# Patient Record
Sex: Male | Born: 1955 | Race: White | Hispanic: No | Marital: Married | Smoking: Former smoker
Health system: Southern US, Community
[De-identification: ages and names within clinical notes are randomized; demographics above are authoritative.]

## PROBLEM LIST (undated history)

## (undated) DIAGNOSIS — I10 Essential (primary) hypertension: Secondary | ICD-10-CM

## (undated) DIAGNOSIS — E1169 Type 2 diabetes mellitus with other specified complication: Secondary | ICD-10-CM

## (undated) DIAGNOSIS — C801 Malignant (primary) neoplasm, unspecified: Secondary | ICD-10-CM

## (undated) DIAGNOSIS — I499 Cardiac arrhythmia, unspecified: Secondary | ICD-10-CM

## (undated) HISTORY — PX: HERNIA REPAIR: SHX51

---

## 1978-09-21 HISTORY — PX: TRIGGER FINGER RELEASE: SHX641

## 2010-09-09 DIAGNOSIS — D039 Melanoma in situ, unspecified: Secondary | ICD-10-CM

## 2010-09-09 HISTORY — DX: Melanoma in situ, unspecified: D03.9

## 2013-09-21 HISTORY — PX: PROSTATECTOMY: SHX69

## 2016-09-21 HISTORY — PX: CARDIAC ELECTROPHYSIOLOGY MAPPING AND ABLATION: SHX1292

## 2017-02-10 DIAGNOSIS — C4492 Squamous cell carcinoma of skin, unspecified: Secondary | ICD-10-CM

## 2017-02-10 HISTORY — DX: Squamous cell carcinoma of skin, unspecified: C44.92

## 2019-10-31 ENCOUNTER — Other Ambulatory Visit: Payer: Self-pay | Admitting: Orthopedic Surgery

## 2019-11-24 ENCOUNTER — Encounter
Admission: RE | Admit: 2019-11-24 | Discharge: 2019-11-24 | Disposition: A | Discharge status: Home / Self Care | Payer: BC Managed Care – PPO | Source: Ambulatory Visit | Attending: Orthopedic Surgery | Admitting: Orthopedic Surgery

## 2019-11-24 ENCOUNTER — Other Ambulatory Visit: Payer: Self-pay

## 2019-11-24 HISTORY — DX: Type 2 diabetes mellitus with other specified complication: E11.69

## 2019-11-24 HISTORY — DX: Cardiac arrhythmia, unspecified: I49.9

## 2019-11-24 HISTORY — DX: Malignant (primary) neoplasm, unspecified: C80.1

## 2019-11-24 HISTORY — DX: Essential (primary) hypertension: I10

## 2019-11-24 NOTE — Patient Instructions (Addendum)
INSTRUCTIONS FOR SURGERY     Your surgery is scheduled for:   Tuesday, MARCH 16TH     To find out your arrival time for the day of surgery,          please call [phone] between 1 pm and 3 pm on :  Monday, MARCH 15TH     When you arrive for surgery, report to the [facility].       Do NOT stop on the first floor to register.    REMEMBER: Instructions that are not followed completely may result in serious medical risk,  up to and including death, or upon the discretion of your surgeon and anesthesiologist,            your surgery may need to be rescheduled.  __X__ 1. Do not eat food after midnight the night before your procedure.                    No gum, candy, lozenger, tic tacs, tums or hard candies.                  ABSOLUTELY NOTHING SOLID IN YOUR MOUTH AFTER MIDNIGHT                    You may drink unlimited clear liquids up to 2 hours before you are scheduled to arrive for surgery.                   Do not drink anything within those 2 hours unless you need to take medicine, then take the                   smallest amount you need.  Clear liquids include:  water, apple juice without pulp,                   any flavor Gatorade, Black coffee, black tea.  Sugar may be added but no dairy/ honey /lemon.                        Broth and jello is not considered a clear liquid.  __x__  2. On the morning of surgery, please brush your teeth with toothpaste and water. You may rinse with                  mouthwash if you wish but DO NOT SWALLOW TOOTHPASTE OR MOUTHWASH  __X___3. NO alcohol for 24 hours before or after surgery.  __x___ 4.  Do NOT smoke or use e-cigarettes for 24 HOURS PRIOR TO SURGERY.                      DO NOT Use any chewable tobacco products for at least 6 hours prior to surgery.  __x___ 5. If you start any new medication after this appointment and prior to surgery, please    Bring it with you on the day of surgery.  ___x__ 6. Notify your doctor if there is any change in your medical condition, such as fever, infection, vomitting,  Diarrhea or any open sores.  __x___ 7.  USE the CHG SOAP as instructed, the night before surgery and the day of surgery.                   Once you have washed with this soap, do NOT use any of the following: Powders, perfumes                    or lotions. Please do not wear make up, hairpins, clips or nail polish. You MAY NOT wear deodorant.                   Men may shave their face and neck.  Women need to shave 48 hours prior to surgery.                   DO NOT wear ANY jewelry on the day of surgery. If there are rings that are too tight to                    remove easily, please address this prior to the surgery day. Piercings need to be removed.                                                                     NO METAL ON YOUR BODY.                    Do NOT bring any valuables.  If you came to Pre-Admit testing then you will not need license,                     insurance card or credit card.  If you will be staying overnight, please either leave your things in                     the car or have your family be responsible for these items.                     [facility] IS NOT RESPONSIBLE FOR BELONGINGS OR VALUABLES.  ___X__ 8. DO NOT wear contact lenses on surgery day.  You may not have dentures,                     Hearing aides, contacts or glasses in the operating room. These items can be                    Placed in the Recovery Room to receive immediately after surgery.  __x___ 9. IF YOU ARE SCHEDULED TO GO HOME ON THE SAME DAY, YOU MUST                   Have someone to drive you home and to stay with you  for the first 24 hours.                    Have an arrangement prior to arriving on surgery day.  ___x__ 10. Take the following medications on the morning of surgery with a sip of water:  1.  CARDIZEM                     2.  METOPROLOL                     3.  ZOLOFT                     4.  TRAMADOL, if you need it                     5.                     6.  _____ 11.  Follow any instructions provided to you by your surgeon.                        Such as enema, clear liquid bowel prep  __X__  12. STOP  ASPIRIN AS OF:  MARCH 9TH (one week prior to surgery)                       THIS INCLUDES BC POWDERS / GOODIES POWDER  __x___ 13. STOP Anti-inflammatories as of: MARCH 9TH (one week prior to surgery)                      This includes IBUPROFEN / MOTRIN / ADVIL / ALEVE/ NAPROXYN                    YOU MAY TAKE TYLENOL ANY TIME PRIOR TO SURGERY.  __X____16.  Stop Metformin 2 full days prior to surgery.  Stop AFTER EVENING DOSE ON                       Saturday, MARCH 13TH.                      Do NOT take any diabetes medications on surgery day.  __X____17.  Continue to take the following medications but do not take on the morning of surgery:                            LOTENSIN // JARDIANCE  ___x___18. Wear clean and comfortable clothing to the hospital.                    A large, oversized button up shirt works best.  If you have a tshirt or pajama top that you                     Will wear once home, please bring that so that we can help you get into that.

## 2019-12-01 ENCOUNTER — Other Ambulatory Visit: Payer: Self-pay

## 2019-12-01 ENCOUNTER — Encounter
Admission: RE | Admit: 2019-12-01 | Discharge: 2019-12-01 | Disposition: A | Discharge status: Home / Self Care | Payer: BC Managed Care – PPO | Source: Ambulatory Visit | Attending: Orthopedic Surgery | Admitting: Orthopedic Surgery

## 2019-12-01 DIAGNOSIS — E118 Type 2 diabetes mellitus with unspecified complications: Secondary | ICD-10-CM | POA: Insufficient documentation

## 2019-12-01 DIAGNOSIS — Z20822 Contact with and (suspected) exposure to covid-19: Secondary | ICD-10-CM | POA: Diagnosis not present

## 2019-12-01 DIAGNOSIS — Z01818 Encounter for other preprocedural examination: Secondary | ICD-10-CM | POA: Insufficient documentation

## 2019-12-01 DIAGNOSIS — I10 Essential (primary) hypertension: Secondary | ICD-10-CM | POA: Insufficient documentation

## 2019-12-01 LAB — CBC WITH DIFFERENTIAL/PLATELET
Abs Immature Granulocytes: 0.02 10*3/uL (ref 0.00–0.07)
Basophils Absolute: 0 10*3/uL (ref 0.0–0.1)
Basophils Relative: 1 %
Eosinophils Absolute: 0.3 10*3/uL (ref 0.0–0.5)
Eosinophils Relative: 5 %
HCT: 43.3 % (ref 39.0–52.0)
Hemoglobin: 14.9 g/dL (ref 13.0–17.0)
Immature Granulocytes: 0 %
Lymphocytes Relative: 25 %
Lymphs Abs: 1.8 10*3/uL (ref 0.7–4.0)
MCH: 30.2 pg (ref 26.0–34.0)
MCHC: 34.4 g/dL (ref 30.0–36.0)
MCV: 87.7 fL (ref 80.0–100.0)
Monocytes Absolute: 0.8 10*3/uL (ref 0.1–1.0)
Monocytes Relative: 11 %
Neutro Abs: 4 10*3/uL (ref 1.7–7.7)
Neutrophils Relative %: 58 %
Platelets: 240 10*3/uL (ref 150–400)
RBC: 4.94 MIL/uL (ref 4.22–5.81)
RDW: 12.4 % (ref 11.5–15.5)
WBC: 6.9 10*3/uL (ref 4.0–10.5)
nRBC: 0 % (ref 0.0–0.2)

## 2019-12-01 LAB — BASIC METABOLIC PANEL
Anion gap: 10 (ref 5–15)
BUN: 19 mg/dL (ref 8–23)
CO2: 23 mmol/L (ref 22–32)
Calcium: 9.3 mg/dL (ref 8.9–10.3)
Chloride: 105 mmol/L (ref 98–111)
Creatinine, Ser: 0.9 mg/dL (ref 0.61–1.24)
GFR calc Af Amer: >60 mL/min (ref >60)
GFR calc non Af Amer: >60 mL/min (ref >60)
Glucose, Bld: 180 mg/dL — ABNORMAL HIGH (ref 70–99)
Potassium: 3.9 mmol/L (ref 3.5–5.1)
Sodium: 138 mmol/L (ref 135–145)

## 2019-12-01 LAB — PROTIME-INR
INR: 0.9 (ref 0.8–1.2)
Prothrombin Time: 11.6 seconds (ref 11.4–15.2)

## 2019-12-01 LAB — APTT: aPTT: 30 seconds (ref 24–36)

## 2019-12-01 NOTE — Pre-Procedure Instructions (Signed)
ECG 12-lead7/24/2019 [place] Component Name Value Ref Range  Vent Rate (bpm) 67   PR Interval (msec) 192   QRS Interval (msec) 150   QT Interval (msec) 458   QTc (msec) 483   Other Result Information  This result has an attachment that is not available.  Result Narrative  Sinus rhythm with frequent premature ventricular complexes in a pattern of bigeminy Left anterior fascicular block Right bundle branch block  Bifascicular block  Abnormal ECG When compared with ECG of 13-Apr-2017 14:34, T wave inversion less evident in Anterior leads I reviewed and concur with this report. Electronically signed [id number] MD, Jeneen Rinks [phone]) on 04/18/2018 2:58:43 PM  Status Results Details   Encounter Summary

## 2019-12-01 NOTE — Pre-Procedure Instructions (Signed)
Progress Notes - documented in this encounter Leonia Reeves, MD - 05/15/2019 7:45 AM EDT Formatting of this note might be different from the original. Established Patient Visit   Reason for referral: Chief Complaint  Patient presents with  . Follow-up  Date of Service: 05/15/2019 Date of Birth: 03-04-1956 PCP: Velna Ochs, MD  History of Present Illness: Lawrence Flores is a 64 y.o.male patient with a history of atrial flutter (s/p RFA, 2016) who presents for Cardiology follow-up.  Lawrence Flores has been doing well from a cardiac standpoint. He states that every time he goes for a procedure there is mention of PVCs or his heart rate. He is asymptomatic from this and has noticed an improvement with them since starting on diltiazem. His dyspnea has been stable and he has noticed a great improvement in his breathing since losing weight. The patient denies orthopnea, and paroxysmal nocturnal dyspnea. He also denies palpitations, syncope, pre-syncope, and chest pain. His HCTZ was stopped due to leg cramping.  His blood pressure has been well controlled. He is due for labs but is not scheduled for lab work.   Review of Systems  All other systems reviewed and are negative except as documented in the HPI.   Past Medical and Surgical History   Past Medical History:  Diagnosis Date  . Allergic state  MRI dye. Possibly anectine  . Arthritis  . Bundle branch block, right 03/04/2016  . Cataract cortical, senile [id number]  Age appropriate  . 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 Depression, prolonged 09/06/2018  . Diabetes (CMS-HCC)  . Elevated PSA 12/25/2013  . History of melanoma in situ 07/28/2011  . Hypertension  . Malignant melanoma (CMS-HCC)  . Malignant neoplasm of prostate (CMS-HCC) 05/02/2014  . 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 Obstructive sleep apnea 04/06/2016   Past Surgical History He has a past surgical history that  includes Umbilical hernia repair (1979); Laminectomy Lumbar Spine; laparoscopic prostatectomy robotic (N/A, 07/19/2014); laparoscopic pelvic lymph node sampling (Left, 07/19/2014); and Colonoscopy (03/13/2019).   Medications and Allergies   Current Outpatient Medications  Medication Sig Dispense Refill  . aspirin 81 MG EC tablet Take 81 mg by mouth once daily.  . benazepriL (LOTENSIN) 40 MG tablet Take 1 tablet (40 mg total) by mouth once daily 90 tablet 3  . CONTOUR NEXT TEST STRIPS test strip TEST 3 TIMES DAILY 100 each 0  . diltiazem (CARDIZEM CD) 240 MG CD capsule Take 1 capsule (240 mg total) by mouth once daily 90 capsule 3  . empagliflozin (JARDIANCE) 10 mg tablet Take 1 tablet (10 mg total) by mouth daily with breakfast 30 tablet 11  . metFORMIN (GLUCOPHAGE) 500 MG tablet TAKE 1 TABLET BY MOUTH TWICE DAILY WITH MEALS 60 tablet 11  . metoprolol succinate (TOPROL-XL) 25 MG XL tablet Take 1 tablet (25 mg total) by mouth once daily 90 tablet 3  . rosuvastatin (CRESTOR) 10 MG tablet Take 1 tablet (10 mg total) by mouth once daily 90 tablet 3  . sertraline (ZOLOFT) 100 MG tablet Take 1 tablet (100 mg total) by mouth once daily 30 tablet 11  . traMADoL (ULTRAM) 50 mg tablet TAKE 1 TABLET BY MOUTH TWICE DAILY AS NEEDED FOR PAIN 30 tablet 1  . sildenafil (VIAGRA) 100 MG tablet Take 1/2 tablet 3 nights a week for penile rehab (Patient not taking: Reported on 05/15/2019 ) 10 tablet 11   No current facility-administered medications for this visit.   Allergies:  Anectine [succinylcholine chloride] and Iodinated contrast media  Social and Family History   Social History   Tobacco Use  . Smoking status: Former Smoker  Packs/day: 2.00  Years: 20.00  Pack years: 40.00  Types: Cigarettes  Quit date: 09/21/1994  Years since quitting: 24.6  . Smokeless tobacco: Former Systems developer  Types: Chew, Snuff  . Tobacco comment: quit smokeless tobacco in 1996 as well  Substance Use Topics  . Alcohol use: Not  Currently  Comment: reports h/o heavy ETOH use, however denies daily ETOH consumption currently  . Drug use: Never   Family History: family history includes Alcohol abuse in his father, maternal grandfather, and maternal uncle; Alzheimer's disease in his father and mother; Anesthesia problems in his father and sister; Anxiety in his mother; Breast cancer in his mother; Diabetes type II in his father, mother, and paternal grandfather; Glaucoma in his father; High blood pressure (Hypertension) in his mother; Prostate cancer in his paternal grandfather; Skin cancer in his father; Thyroid disease in his mother.  Physical Examination  Vitals:BP 112/70 (BP Location: Left upper arm, Patient Position: Sitting, BP Cuff Size: Adult)  Pulse 62  Temp 36.5 C (97.7 F)  Ht 177.8 cm (5\' 10" )  Wt (!) 112.8 kg (248 lb 10.9 oz)  BMI 35.68 kg/m   Physical Exam  Constitutional: He appears healthy. No distress.  HENT:  Mouth/Throat: Oropharynx is clear.  Eyes: Pupils are equal, round, and reactive to light. Conjunctivae are normal.  Neck: Normal range of motion and thyroid normal. Neck supple. No JVD present.  Cardiovascular: Regular rate and rhythm, S2 normal, normal heart sounds and normal pulses. PMI is not displaced.  Pulmonary/Chest: Breath sounds normal. He exhibits no tenderness.  Abdominal: Soft. Bowel sounds are normal. He exhibits no distension. There is no tenderness.  Musculoskeletal: No edema.  Neurological: He is alert and oriented to person, place, and time.  Skin: Skin is warm. No cyanosis. Nails show no clubbing.    Lab and Other Data   - Lab Results  Component Value Date  CHOLTOTAL 188 07/05/2018  TRIG 109 07/05/2018  HDL 58.0 07/05/2018  LDLCALC 108 07/05/2018   EKG (March 04, 2016): Typical atrial flutter with right bundle branch block  Left Heart Catheterization 05/03/18 Impressions:  LVEDP 27mmHg  No significant CAD 30% midRCA 20-30% ostial LCx 10-20%  prox/midLAD  Mild coronary ectasia, throughout.  Stress Echo 04/25/18 INTERPRETATION ABNORMAL STRESS ECHOCARDIOGRAM LCX TERRITORY WALL MOTION ABNORMALITIES WITH PEAK EXERTION STRESS EKG RESULT IS LIKELY A FALSE NEGATIVE RESULT. NO SIGNIFICANT VALVULAR DYSFUNCTION NORMAL RV SIZE AND FUNCTION NO PRIOR SE FOR COMPARISON  Assessment and Plan   1. Essential hypertension, benign  2. Bundle branch block, right  3. Typical atrial flutter (CMS-HCC)  4. DOE (dyspnea on exertion)  5. Heart palpitations  6. Hyperlipemia, mixed  7. Tachycardia   1. Atrial flutter. Typical. CHADSVASC 1 (HTN). S/p RFA without recurrence. Continued on Cardizem without [place].   2. Hypertension. His blood pressure is well controlled on current regimen.   3. Ventricular bigeminy. Continue Carvedilol.   4. Right bundle branch block (with 1st degree AV block). This has been stable. No need for further treatment at this time. Annual EKG for surveillance.   5. Hyperlipidemia. Will check fasting lipid panel today and adjust his Crestor dose as needed.   I appreciate the opportunity to participate in his care. As always, please feel free to call with questions.  Requested Prescriptions   Signed Prescriptions Disp Refills  . diltiazem (  CARDIZEM CD) 240 MG CD capsule 90 capsule 3  Sig: Take 1 capsule (240 mg total) by mouth once daily  . rosuvastatin (CRESTOR) 10 MG tablet 90 tablet 3  Sig: Take 1 tablet (10 mg total) by mouth once daily  . metoprolol succinate (TOPROL-XL) 25 MG XL tablet 90 tablet 3  Sig: Take 1 tablet (25 mg total) by mouth once daily  . benazepriL (LOTENSIN) 40 MG tablet 90 tablet 3  Sig: Take 1 tablet (40 mg total) by mouth once daily   Non-Medicine Orders: Orders Placed This Encounter  Procedures  . Alanine Aminotransferase (ALT)  . Aspartate Aminotransferase (AST)  . Lipid Panel W/Reflex Direct Low Density Lipoprotein (LDL) Cholesterol  . Basic Metabolic Panel (BMP)  . Hemoglobin A1C   . Complete Blood Count (CBC) with Differential   Future Appointments  You do not currently have any appointments scheduled.    Scribe Attestation: Gonzella Lex, am acting as scribe for Jeannetta Nap, MD.    Electronically signed by Leonia Reeves, MD at 05/19/2019 1:18 PM EDT

## 2019-12-01 NOTE — Pre-Procedure Instructions (Signed)
Telephone Encounter - Leonia Reeves, MD - 11/02/2019 2:33 PM EST Thanks for the note. No need for further testing. Ok to stop aspirin. Continue other meds. -MB  Electronically signed by Leonia Reeves, MD at 11/02/2019 2:33 PM EST  Back to top of Miscellaneous Notes Telephone Encounter - Sharlyn Bologna, RN - 10/30/2019 3:45 PM EST Formatting of this note might be different from the original. Received pre-op clearance request from [facility]  Name of surgeon: Mack Guise  Type of surgery: R shoulder arthroscopic  Time line for surgery: TBD  Type of anesthesia: general  Date of last- Stress: Echo: Cath: Office Visit: 04/2018 04/2018 04/2018 04/2019  Meds: ASA 81mg   Cardiac symptoms? no   Nursing note: Form in CC To be faxed to [facility]. [facility] [phone]  Electronically signed by Sharlyn Bologna, RN at 10/30/2019 4:35 PM EST

## 2019-12-01 NOTE — Pre-Procedure Instructions (Addendum)
SECURE CHAT WITH DR Kayleen Memos:  Pt having Shoulder Surgery with Dr Mack Guise 3-16. Pt with htn and h/o A-flutter. Can you review EKG done today. Pt had one done in 2019 in [place] and the interpretation is the same. He did have Stress Echo done in 2019 in [place] ( I copy and pasted so you can review). Dr Tobe Sos (cardiologist) did say in Feb no further testing was needed when [facility] office got cardiac clearance on pt for this surgery. Are we still ok to proceed since this EKG has same interpretation as the one in 2019?  Lawrence Flores, I reviewed the cardiologists note from 8/20 and he has unchanged RBBB with occasional PVCs . He should be OK for surgery without clearance. If the patient has a primary MD , we could fax the EKG over to them to see if any issues on their part, but I see a stable chronic situation with minimal CAD on cardiac cath according to cardiology  [facility] Dr Tobe Sos (cardiology) did say last month that pt did not need any additional testing from his cardiology standpoint. I just wanted to make sure we were ok with the EKG done today. Thanks!  Yes.Marland KitchenMarland Kitchen

## 2019-12-01 NOTE — Pre-Procedure Instructions (Signed)
Echo stress test8/01/2018 [facility] Component Name Value Ref Range  LV Ejection Fraction (%) 55   Aortic Valve Regurgitation Grade trivial   Aortic Valve Stenosis Grade none   Mitral Valve Regurgitation Grade trivial   Mitral Valve Stenosis Grade none   Tricuspid Valve Regurgitation Grade trivial   LV End Diastolic Diameter (cm) 5.4 cm  LV End Systolic Diameter (cm) 3.3 cm  LV Septum Wall Thickness (cm) 1.3 cm  LV Posterior Wall Thickness (cm) 1.3 cm  Left Atrium Diameter (cm) 3.8 cm  Result Narrative           [facility]         [place], [place]      An affiliate of [facility]           [phone]      [place] #: [id number]                                Date: 04/25/2018 08: [place], [place] [address]                  Adult  Male  Age: 64 yrs                                Outpatient            ECHOCARDIOGRAM REPORT          DUH^^THA                                MD1: Ernesta Amble MATTHEW    STUDY:Stress Echo       TAPE:    ECHO:Yes  DOPPLER:Yes    FILE:0000-000-000    BP: 120/71 mmHg    COLOR:Yes  CONTRAST:No   MACHINE:GE         HR: 74  RV BIOPSY:No     3D:No SOUND QLTY:Moderate      Height: 70 in   MEDIUM:None                       Weight: 249 lb                                BSA: 2.3 m2 _________________________________________________________________________________________       Indication:Indication:  Indication: Bundle branch block, right [I45.10, DOE                          (dyspnea on exertion)  [R06.09 _________________________________________________________________________________________ STRESS ECHOCARDIOGRAPHY        Protocol: Treadmill (Bruce)         Drugs: None       Target HR: 134 bpm      Maximum Predicted HR: 158 bpm +----------------------------------------------------------------------------+ :Stage      Duration           HR     BP         : :  SITTING   :              :74    :120/71       : :---------------+----------------------------+----------+---------+     : :  STANDING   :              :68    :123/75       : :---------------+----------------------------+----------+---------+     : :  STAGE 1   :3:00            :100    :148/59       : :---------------+----------------------------+----------+---------+     : :  STAGE 2   :3:00            :113    :182/56       : :---------------:----------------------------:----------:---------+     : :  STAGE 3   :3:00            :141    :214/77       : :---------------+----------------------------+----------+---------+     : :  STAGE 4   :0:18            :136    :/          : :---------------+----------------------------+----------+---------+     : : RECOVERY   :7:02            :78    :113/71       : +----------------------------------------------------------------------------+    Stress Duration: 9: 18 mm: ss    Max Stress H.R.: 141 bpm       Target HR Achieved: Yes Maximum workload of 10.50 METS was achieved during exercise _________________________________________________________________________________________ WALL SEGMENT CHANGES             Rest        Stress    Anterior Septum: Normal       Hyperkinetic      Anterior Wall: Normal       Hyperkinetic      Lateral Wall: Normal       HYPOKINETIC     Posterior Wall: Normal       Hyperkinetic     Inferior Wall: Normal       Hyperkinetic    Inferior Septum: Normal       Hyperkinetic          Apex: Normal       HYPOKINETIC       Resting EF: >55% (Est.)         Stress EF: 45% (Est.)  _________________________________________________________________________________________ ADDITIONAL FINDINGS _________________________________________________________________________________________ STRESS ECG RESULTS      ECG Results: Normal _________________________________________________________________________________________ ECHOCARDIOGRAPHIC DESCRIPTIONS LEFT VENTRICLE          Size: Normal      Contraction: Normal       LV Masses: No Masses          LVH: Mild LVH CONCENTRIC      Dias.FxClass: N/A RIGHT VENTRICLE          Size: Normal            Free Wall: Normal      Contraction: Normal            RV Masses: No mass PERICARDIUM         Fluid: No effusion _________________________________________________________________________________________  DOPPLER ECHO and OTHER SPECIAL PROCEDURES         Aortic: TRIVIAL AR         No AS         Mitral: TRIVIAL MR         No MS             MV Inflow E Vel = 95.4 cm/sec    MV Annulus  E'Vel = nm*             E/E'Ratio = nm*       Tricuspid: TRIVIAL TR         No TS       Pulmonary: No PR           No PS _________________________________________________________________________________________ ECHOCARDIOGRAPHIC MEASUREMENTS 2D DIMENSIONS AORTA         Values  Normal Range  MAIN PA     Values  Normal Range        Annulus: nm*    [2.3-2.9]      PA  Main: nm*    [1.5-2.1]       Aorta Sin: 3.9 cm   [3.1-3.7]  RIGHT VENTRICLE      ST Junction: nm*    [2.6-3.2]      RV Base: nm*    [<4.2]       Asc.Aorta: nm*    [2.6-3.4]      RV Mid: nm*    [<3.5] LEFT VENTRICLE                   RV Length: nm*    [<8.6]         LVIDd: 5.4 cm   [4.2-5.9]  INFERIOR VENA CAVA         LVIDs: 3.3 cm            Max. IVC: nm*    [<=2.1]           FS: 39.1 %   [>25]       Min. IVC: nm*          SWT: 1.3 cm   [0.6-1.0]  ------------------          PWT: 1.3 cm   [0.6-1.0]  nm* - not measured LEFT ATRIUM        LA Diam: 3.8 cm   [3.0-4.0]      LA A4C Area: 23.6 cm2  [< 20]       LA Volume: 63.1 ml  [18-58] _________________________________________________________________________________________ INTERPRETATION ABNORMAL STRESS ECHOCARDIOGRAM LCX TERRITORY WALL MOTION ABNORMALITIES WITH PEAK EXERTION STRESS EKG RESULT IS LIKELY A FALSE NEGATIVE RESULT. NO SIGNIFICANT VALVULAR DYSFUNCTION NORMAL RV SIZE AND FUNCTION NO PRIOR SE FOR COMPARISON _________________________________________________________________________________________ Electronically signed by Leonia Reeves, MD on 04/25/2018 05: 13 PM      Performed By: Arville Go, [place]   Ordering Physician: Ernesta Amble _________________________________________________________________________________________  Other Result Information  Interface, Text Results In - 04/25/2018  5:13 PM EDT                   [facility]                  [place]           An affiliate of [facility]           [place] #: [id number]  Date: 04/25/2018 08: 03 AM           [place], [place] [address]                                    Adult   Male   Age: 9 yrs                                                              Outpatient                      ECHOCARDIOGRAM REPORT                   DUH^^THA                                                              MD1: Ernesta Amble MATTHEW      STUDY:Stress Echo              TAPE:       ECHO:Yes    DOPPLER:Yes       FILE:0000-000-000        BP: 120/71 mmHg      COLOR:Yes   CONTRAST:No     MACHINE:GE                  HR: 56  RV BIOPSY:No          3D:No  SOUND QLTY:Moderate            Height: 70 in     MEDIUM:None                                              Weight: 249 lb                                                              BSA: 2.3 m2 _________________________________________________________________________________________            Indication:Indication:    Indication: Bundle branch block, right [I45.10, DOE                                                  (dyspnea on exertion) [R06.09 _________________________________________________________________________________________ STRESS ECHOCARDIOGRAPHY              Protocol: Treadmill (Bruce)                 Drugs: None             Target HR: 134 bpm           Maximum  Predicted HR: 158 bpm +----------------------------------------------------------------------------+ :Stage           Duration                     HR         BP                  : :   SITTING     :                            :74        :120/71              : :---------------+----------------------------+----------+---------+          : :  STANDING     :                            :68        :123/75              : :---------------+----------------------------+----------+---------+          : :   STAGE 1     :3:00                        :100       :148/59              : :---------------+----------------------------+----------+---------+          : :   STAGE 2     :3:00                        :113       :182/56               : :---------------:----------------------------:----------:---------+          : :   STAGE 3     :3:00                        :141       :214/77              : :---------------+----------------------------+----------+---------+          : :   STAGE 4     :0:18                        :136       :/                   : :---------------+----------------------------+----------+---------+          : :  RECOVERY     :7:02                        :78        :113/71              : +----------------------------------------------------------------------------+       Stress Duration: 9: 18 mm: ss       Max Stress H.R.: 141 bpm             Target HR Achieved: Yes Maximum workload of 10.50 METS was achieved during exercise _________________________________________________________________________________________ WALL SEGMENT CHANGES  Rest                Stress       Anterior Septum: Normal              Hyperkinetic         Anterior Wall: Normal              Hyperkinetic          Lateral Wall: Normal              HYPOKINETIC        Posterior Wall: Normal              Hyperkinetic         Inferior Wall: Normal              Hyperkinetic       Inferior Septum: Normal              Hyperkinetic                  Apex: Normal              HYPOKINETIC            Resting EF: >55% (Est.)                  Stress EF: 45% (Est.)  _________________________________________________________________________________________ ADDITIONAL FINDINGS _________________________________________________________________________________________ STRESS ECG RESULTS           ECG Results: Normal _________________________________________________________________________________________ ECHOCARDIOGRAPHIC DESCRIPTIONS LEFT VENTRICLE                  Size: Normal           Contraction: Normal             LV Masses: No Masses                   LVH: Mild LVH CONCENTRIC          Dias.FxClass: N/A RIGHT VENTRICLE                   Size: Normal                       Free Wall: Normal           Contraction: Normal                       RV Masses: No mass PERICARDIUM                 Fluid: No effusion _________________________________________________________________________________________  DOPPLER ECHO and OTHER SPECIAL PROCEDURES                Aortic: TRIVIAL AR                 No AS                Mitral: TRIVIAL MR                 No MS                        MV Inflow E Vel = 95.4 cm/sec       MV Annulus E'Vel = nm*                        E/E'Ratio = nm*  Tricuspid: TRIVIAL TR                 No TS             Pulmonary: No PR                      No PS _________________________________________________________________________________________ ECHOCARDIOGRAPHIC MEASUREMENTS 2D DIMENSIONS AORTA                  Values   Normal Range   MAIN PA         Values    Normal Range               Annulus: nm*        [2.3-2.9]           PA Main: nm*       [1.5-2.1]             Aorta Sin: 3.9 cm     [3.1-3.7]    RIGHT VENTRICLE           ST Junction: nm*        [2.6-3.2]           RV Base: nm*       [<4.2]             Asc.Aorta: nm*        [2.6-3.4]            RV Mid: nm*       [<3.5] LEFT VENTRICLE                                      RV Length: nm*       [<8.6]                 LVIDd: 5.4 cm     [4.2-5.9]    INFERIOR VENA CAVA                 LVIDs: 3.3 cm                        Max. IVC: nm*       [<=2.1]                    FS: 39.1 %     [>25]              Min. IVC: nm*                   SWT: 1.3 cm     [0.6-1.0]    ------------------                   PWT: 1.3 cm     [0.6-1.0]    nm* - not measured LEFT ATRIUM               LA Diam: 3.8 cm     [3.0-4.0]           LA A4C Area: 23.6 cm2   [< 20]             LA Volume: 63.1 ml    [18-58] _________________________________________________________________________________________ INTERPRETATION ABNORMAL STRESS ECHOCARDIOGRAM LCX TERRITORY WALL MOTION  ABNORMALITIES WITH PEAK EXERTION STRESS EKG RESULT IS LIKELY A FALSE NEGATIVE RESULT. NO SIGNIFICANT VALVULAR DYSFUNCTION NORMAL RV SIZE AND FUNCTION NO PRIOR SE FOR COMPARISON _________________________________________________________________________________________ Electronically signed  by Leonia Reeves, MD on 04/25/2018 05: 13 PM          Performed By: Arville Go, [place]    Ordering Physician: Ernesta Amble _________________________________________________________________________________________  Status Results Details   Encounter Summary

## 2019-12-01 NOTE — Pre-Procedure Instructions (Deleted)
Patient Instructions - documented in this encounter Patient Instructions Leonia Reeves, MD - 08/12/2018 10:40 AM EST  Start HCTZ 25mg  daily. If dizziness, then cut to 12.5mg  daily  Start potassium supplement 40meq daily   Patient Education    hydrochlorothiazide Pronunciation: HYE dro KLOR o THY a zide Brand: Microzide What is the most important information I should know about hydrochlorothiazide? You should not use this medicine if you are unable to urinate. What is hydrochlorothiazide? Hydrochlorothiazide is a thiazide diuretic (water pill) that helps prevent your body from absorbing too much salt, which can cause fluid retention. Hydrochlorothiazide is used to treat high blood pressure (hypertension). Hydrochlorothiazide may also be used for purposes not listed in this medication guide. What should I discuss with my healthcare provider before taking hydrochlorothiazide? You should not use hydrochlorothiazide if you are allergic to it, or if you are unable to urinate. To make sure hydrochlorothiazide is safe for you, tell your doctor if you have:  kidney disease;  liver disease;  gout;  glaucoma;  low levels of potassium or sodium in your blood;  high levels of calcium in your blood;  a parathyroid gland disorder;  diabetes; or  an allergy to sulfa drugs or penicillin. Hydrochlorothiazide is not expected to be harmful to an unborn baby. However, if you take this medicine during pregnancy, your newborn baby may develop jaundice or other problems. Tell your doctor if you are pregnant or plan to become pregnant while taking hydrochlorothiazide. Hydrochlorothiazide can pass into breast milk and may harm a nursing baby. You should not breast-feed while using this medicine. Hydrochlorothiazide is not approved for use by anyone younger than 65 years old. How should I take hydrochlorothiazide? Follow all directions on your prescription label. Your doctor may  occasionally change your dose to make sure you get the best results. Do not use this medicine in larger or smaller amounts or for longer than recommended. Hydrochlorothiazide is usually taken once per day. Follow your doctor's dosing instructions very carefully. Call your doctor if you have ongoing vomiting or diarrhea, or if you are sweating more than usual. You can easily become dehydrated while taking this medicine, which can lead to severely low blood pressure or a serious electrolyte imbalance. While using hydrochlorothiazide, you may need frequent medical tests and blood pressure be checks. Your blood and urine may both be tested if you have been vomiting or are dehydrated. If you need surgery, tell the surgeon ahead of time that you are using hydrochlorothiazide. You may need to stop using the medicine for a short time. Keep using this medicine as directed, even if you feel well. High blood pressure often has no symptoms. You may need to use blood pressure medicine for the rest of your life. Store at room temperature away from moisture, heat, and freezing. Keep the bottle tightly closed when not in use. What happens if I miss a dose? Take the missed dose as soon as you remember. Skip the missed dose if it is almost time for your next scheduled dose. Do not take extra medicine to make up the missed dose. What happens if I overdose? Seek emergency medical attention or call the Poison Help line at [phone]. Overdose symptoms may include nausea, weakness, dizziness, dry mouth, thirst, and muscle pain or weakness. What should I avoid while taking hydrochlorothiazide? Drinking alcohol with this medicine can cause side effects. Avoid becoming overheated or dehydrated during exercise, in hot weather, or by not drinking enough fluids. Follow your doctor's  instructions about the type and amount of liquids you should drink. In some cases, drinking too much liquid can be as unsafe as not drinking  enough. What are the possible side effects of hydrochlorothiazide? Get emergency medical help if you have signs of an allergic reaction: hives; difficulty breathing; swelling of your face, lips, tongue, or throat. Call your doctor at once if you have:  a light-headed feeling, like you might pass out;  eye pain, vision problems;  jaundice (yellowing of the skin or eyes);  pale skin, easy bruising, unusual bleeding (nose, mouth, vagina, or rectum);  shortness of breath, wheezing, cough with foamy mucus, chest pain;  signs of electrolyte imbalance --dry mouth, thirst, drowsiness, lack of energy, restlessness, muscle pain or weakness, fast heart rate, nausea and vomiting, little or no urine; or  severe skin reaction --fever, sore throat, swelling in your face or tongue, burning in your eyes, skin pain followed by a red or purple skin rash that spreads (especially in the face or upper body) and causes blistering and peeling. Common side effects may include:  nausea, vomiting, loss of appetite;  diarrhea, constipation;  muscle spasm; or  dizziness, headache. This is not a complete list of side effects and others may occur. Call your doctor for medical advice about side effects. You may report side effects to FDA at 1-800-FDA-1088. What other drugs will affect hydrochlorothiazide? Taking this medicine with other drugs that make you light-headed can worsen this effect. Ask your doctor before taking a sleeping pill, narcotic pain medicine, muscle relaxer, or medicine for anxiety, depression, or seizures. Tell your doctor about all your current medicines and any you start or stop using, especially:  cholestyramine, colestipol;  insulin or oral diabetes medicine;  lithium;  other blood pressure medications;  steroid medicine; or  NSAIDs (nonsteroidal anti-inflammatory drugs) --aspirin, ibuprofen (Advil, Motrin), naproxen (Aleve), celecoxib, diclofenac, indomethacin, meloxicam, and  others. This list is not complete. Other drugs may interact with hydrochlorothiazide, including prescription and over-the-counter medicines, vitamins, and herbal products. Not all possible interactions are listed in this medication guide. Where can I get more information? Your pharmacist can provide more information about hydrochlorothiazide. Remember, keep this and all other medicines out of the reach of children, never share your medicines with others, and use this medication only for the indication prescribed.  Every effort has been made to ensure that the information provided by [employer] is accurate, up-to-date, and complete, but no guarantee is made to that effect. Drug information contained herein may be time sensitive. [employer] information has been compiled for use by healthcare practitioners and consumers in the [place] and therefore [employer] does not warrant that uses outside of the [place] are appropriate, unless specifically indicated otherwise. [employer]'s drug information does not endorse drugs, diagnose patients or recommend therapy. [employer]'s drug information is an Scientist, research (physical sciences) to assist licensed healthcare practitioners in caring for their patients and/or to serve consumers viewing this service as a supplement to, and not a substitute for, the expertise, skill, knowledge and judgment of healthcare practitioners. The absence of a warning for a given drug or drug combination in no way should be construed to indicate that the drug or drug combination is safe, effective or appropriate for any given patient. [employer] does not assume any responsibility for any aspect of healthcare administered with the aid of information [employer] provides. The information contained herein is not intended to cover all possible uses, directions, precautions, warnings, drug interactions, allergic reactions, or  adverse effects. If you have questions about the drugs you are taking,  check with your doctor, nurse or pharmacist. Copyright [id number] [employer] [place]: 12.01. Revision date: 04/30/2015. Care instructions adapted under license by your healthcare professional. If you have questions about a medical condition or this instruction, always ask your healthcare professional. [place] any warranty or liability for your use of this information.     Electronically signed by Leonia Reeves, MD at 08/12/2018 11:05 AM EST

## 2019-12-02 LAB — SARS CORONAVIRUS 2 (TAT 6-24 HRS): SARS Coronavirus 2: NEGATIVE

## 2019-12-05 ENCOUNTER — Encounter
Admission: RE | Disposition: A | Discharge status: Home / Self Care | Payer: Self-pay | Source: Home / Self Care | Attending: Orthopedic Surgery

## 2019-12-05 ENCOUNTER — Encounter: Payer: Self-pay | Admitting: Orthopedic Surgery

## 2019-12-05 ENCOUNTER — Ambulatory Visit: Payer: BC Managed Care – PPO

## 2019-12-05 ENCOUNTER — Other Ambulatory Visit: Payer: Self-pay

## 2019-12-05 ENCOUNTER — Ambulatory Visit: Payer: BC Managed Care – PPO | Admitting: Anesthesiology

## 2019-12-05 ENCOUNTER — Ambulatory Visit
Admission: RE | Admit: 2019-12-05 | Discharge: 2019-12-05 | Disposition: A | Discharge status: Home / Self Care | Payer: BC Managed Care – PPO | Attending: Orthopedic Surgery | Admitting: Orthopedic Surgery

## 2019-12-05 DIAGNOSIS — Z7982 Long term (current) use of aspirin: Secondary | ICD-10-CM | POA: Insufficient documentation

## 2019-12-05 DIAGNOSIS — E785 Hyperlipidemia, unspecified: Secondary | ICD-10-CM | POA: Insufficient documentation

## 2019-12-05 DIAGNOSIS — I10 Essential (primary) hypertension: Secondary | ICD-10-CM | POA: Insufficient documentation

## 2019-12-05 DIAGNOSIS — Z8546 Personal history of malignant neoplasm of prostate: Secondary | ICD-10-CM | POA: Insufficient documentation

## 2019-12-05 DIAGNOSIS — Z87891 Personal history of nicotine dependence: Secondary | ICD-10-CM | POA: Insufficient documentation

## 2019-12-05 DIAGNOSIS — M75111 Incomplete rotator cuff tear or rupture of right shoulder, not specified as traumatic: Secondary | ICD-10-CM | POA: Insufficient documentation

## 2019-12-05 DIAGNOSIS — E119 Type 2 diabetes mellitus without complications: Secondary | ICD-10-CM | POA: Diagnosis not present

## 2019-12-05 DIAGNOSIS — S43401A Unspecified sprain of right shoulder joint, initial encounter: Secondary | ICD-10-CM | POA: Diagnosis present

## 2019-12-05 DIAGNOSIS — Z7984 Long term (current) use of oral hypoglycemic drugs: Secondary | ICD-10-CM | POA: Insufficient documentation

## 2019-12-05 DIAGNOSIS — M7551 Bursitis of right shoulder: Secondary | ICD-10-CM | POA: Diagnosis not present

## 2019-12-05 DIAGNOSIS — X58XXXA Exposure to other specified factors, initial encounter: Secondary | ICD-10-CM | POA: Insufficient documentation

## 2019-12-05 DIAGNOSIS — Z85828 Personal history of other malignant neoplasm of skin: Secondary | ICD-10-CM | POA: Insufficient documentation

## 2019-12-05 DIAGNOSIS — Z419 Encounter for procedure for purposes other than remedying health state, unspecified: Secondary | ICD-10-CM

## 2019-12-05 DIAGNOSIS — Z79899 Other long term (current) drug therapy: Secondary | ICD-10-CM | POA: Diagnosis not present

## 2019-12-05 HISTORY — PX: SHOULDER ARTHROSCOPY WITH CAPSULORRHAPHY: SHX6454

## 2019-12-05 LAB — GLUCOSE, CAPILLARY
Glucose-Capillary: 192 mg/dL — ABNORMAL HIGH (ref 70–99)
Glucose-Capillary: 248 mg/dL — ABNORMAL HIGH (ref 70–99)

## 2019-12-05 SURGERY — SHOULDER ATHROSCOPY WITH CAPSULORRHAPHY
Anesthesia: General | Site: Shoulder | Laterality: Right

## 2019-12-05 MED ORDER — FENTANYL CITRATE (PF) 100 MCG/2ML IJ SOLN
INTRAMUSCULAR | Status: AC
  Filled 2019-12-05: qty 2

## 2019-12-05 MED ORDER — PROPOFOL 500 MG/50ML IV EMUL
INTRAVENOUS | Status: AC
  Filled 2019-12-05: qty 50

## 2019-12-05 MED ORDER — FENTANYL CITRATE (PF) 100 MCG/2ML IJ SOLN: 25.0000 ug | INTRAMUSCULAR | Status: DC | PRN

## 2019-12-05 MED ORDER — SUGAMMADEX SODIUM 200 MG/2ML IV SOLN
INTRAVENOUS | Status: DC | PRN
  Administered 2019-12-05: 200 mg via INTRAVENOUS

## 2019-12-05 MED ORDER — CEFAZOLIN SODIUM-DEXTROSE 2-4 GM/100ML-% IV SOLN
2.0000 g | INTRAVENOUS | Status: AC
  Administered 2019-12-05: 2 g via INTRAVENOUS

## 2019-12-05 MED ORDER — BUPIVACAINE LIPOSOME 1.3 % IJ SUSP
INTRAMUSCULAR | Status: AC
  Filled 2019-12-05: qty 20

## 2019-12-05 MED ORDER — CHLORHEXIDINE GLUCONATE CLOTH 2 % EX PADS: 6.0000 | MEDICATED_PAD | Freq: Once | CUTANEOUS | Status: DC

## 2019-12-05 MED ORDER — CEFAZOLIN SODIUM-DEXTROSE 2-4 GM/100ML-% IV SOLN
INTRAVENOUS | Status: AC
  Filled 2019-12-05: qty 100

## 2019-12-05 MED ORDER — SODIUM CHLORIDE 0.9 % IV SOLN
INTRAVENOUS | Status: DC | PRN
  Administered 2019-12-05: 50 ug/min via INTRAVENOUS

## 2019-12-05 MED ORDER — DEXAMETHASONE SODIUM PHOSPHATE 10 MG/ML IJ SOLN
INTRAMUSCULAR | Status: DC | PRN
  Administered 2019-12-05: 10 mg via INTRAVENOUS

## 2019-12-05 MED ORDER — KETAMINE HCL 50 MG/ML IJ SOLN
INTRAMUSCULAR | Status: DC | PRN
  Administered 2019-12-05: 25 mg via INTRAMUSCULAR

## 2019-12-05 MED ORDER — ACETAMINOPHEN 10 MG/ML IV SOLN
INTRAVENOUS | Status: DC | PRN
  Administered 2019-12-05: 1000 mg via INTRAVENOUS

## 2019-12-05 MED ORDER — SODIUM CHLORIDE 0.9 % IV SOLN: INTRAVENOUS | Status: DC

## 2019-12-05 MED ORDER — OXYCODONE HCL 5 MG PO TABS: 5.0000 mg | ORAL_TABLET | ORAL | 0 refills | Status: AC | PRN

## 2019-12-05 MED ORDER — DEXAMETHASONE SODIUM PHOSPHATE 10 MG/ML IJ SOLN
INTRAMUSCULAR | Status: AC
  Filled 2019-12-05: qty 1

## 2019-12-05 MED ORDER — ROCURONIUM BROMIDE 10 MG/ML (PF) SYRINGE
PREFILLED_SYRINGE | INTRAVENOUS | Status: AC
  Filled 2019-12-05: qty 10

## 2019-12-05 MED ORDER — PROPOFOL 500 MG/50ML IV EMUL
INTRAVENOUS | Status: DC | PRN
  Administered 2019-12-05: 100 ug/kg/min via INTRAVENOUS

## 2019-12-05 MED ORDER — PROPOFOL 10 MG/ML IV BOLUS
INTRAVENOUS | Status: AC
  Filled 2019-12-05: qty 20

## 2019-12-05 MED ORDER — BUPIVACAINE HCL (PF) 0.5 % IJ SOLN
INTRAMUSCULAR | Status: DC | PRN
  Administered 2019-12-05: 10 mL via PERINEURAL

## 2019-12-05 MED ORDER — LACTATED RINGERS IV SOLN
INTRAVENOUS | Status: DC | PRN
  Administered 2019-12-05: 4 mL

## 2019-12-05 MED ORDER — FENTANYL CITRATE (PF) 100 MCG/2ML IJ SOLN: 50.0000 ug | Freq: Once | INTRAMUSCULAR | Status: AC

## 2019-12-05 MED ORDER — PHENYLEPHRINE HCL (PRESSORS) 10 MG/ML IV SOLN
INTRAVENOUS | Status: DC | PRN
  Administered 2019-12-05: 100 ug via INTRAVENOUS

## 2019-12-05 MED ORDER — FAMOTIDINE 20 MG PO TABS: 20.0000 mg | ORAL_TABLET | Freq: Once | ORAL | Status: DC

## 2019-12-05 MED ORDER — FENTANYL CITRATE (PF) 100 MCG/2ML IJ SOLN
INTRAMUSCULAR | Status: AC
  Administered 2019-12-05: 07:00:00 50 ug via INTRAVENOUS
  Filled 2019-12-05: qty 2

## 2019-12-05 MED ORDER — FAMOTIDINE 20 MG PO TABS
ORAL_TABLET | ORAL | Status: AC
  Filled 2019-12-05: qty 1

## 2019-12-05 MED ORDER — BUPIVACAINE HCL (PF) 0.5 % IJ SOLN
INTRAMUSCULAR | Status: AC
  Filled 2019-12-05: qty 10

## 2019-12-05 MED ORDER — GLYCOPYRROLATE 0.2 MG/ML IJ SOLN
INTRAMUSCULAR | Status: AC
  Filled 2019-12-05: qty 1

## 2019-12-05 MED ORDER — SUCCINYLCHOLINE CHLORIDE 200 MG/10ML IV SOSY
PREFILLED_SYRINGE | INTRAVENOUS | Status: AC
  Filled 2019-12-05: qty 10

## 2019-12-05 MED ORDER — ACETAMINOPHEN 10 MG/ML IV SOLN
INTRAVENOUS | Status: AC
  Filled 2019-12-05: qty 100

## 2019-12-05 MED ORDER — KETAMINE HCL 50 MG/ML IJ SOLN
INTRAMUSCULAR | Status: AC
  Filled 2019-12-05: qty 10

## 2019-12-05 MED ORDER — LIDOCAINE HCL (CARDIAC) PF 100 MG/5ML IV SOSY
PREFILLED_SYRINGE | INTRAVENOUS | Status: DC | PRN
  Administered 2019-12-05: 100 mg via INTRAVENOUS

## 2019-12-05 MED ORDER — BUPIVACAINE LIPOSOME 1.3 % IJ SUSP
INTRAMUSCULAR | Status: DC | PRN
  Administered 2019-12-05: 20 mL via PERINEURAL

## 2019-12-05 MED ORDER — LIDOCAINE HCL (PF) 1 % IJ SOLN
INTRAMUSCULAR | Status: DC | PRN
  Administered 2019-12-05: 5 mL

## 2019-12-05 MED ORDER — EPHEDRINE SULFATE 50 MG/ML IJ SOLN
INTRAMUSCULAR | Status: DC | PRN
  Administered 2019-12-05: 10 mg via INTRAVENOUS
  Administered 2019-12-05 (×2): 5 mg via INTRAVENOUS

## 2019-12-05 MED ORDER — MIDAZOLAM HCL 2 MG/2ML IJ SOLN: 1.0000 mg | Freq: Once | INTRAMUSCULAR | Status: AC

## 2019-12-05 MED ORDER — PHENYLEPHRINE HCL (PRESSORS) 10 MG/ML IV SOLN
INTRAVENOUS | Status: AC
  Filled 2019-12-05: qty 1

## 2019-12-05 MED ORDER — FENTANYL CITRATE (PF) 100 MCG/2ML IJ SOLN
INTRAMUSCULAR | Status: DC | PRN
  Administered 2019-12-05 (×3): 50 ug via INTRAVENOUS

## 2019-12-05 MED ORDER — ONDANSETRON HCL 4 MG/2ML IJ SOLN
INTRAMUSCULAR | Status: AC
  Filled 2019-12-05: qty 2

## 2019-12-05 MED ORDER — PROPOFOL 10 MG/ML IV BOLUS
INTRAVENOUS | Status: DC | PRN
  Administered 2019-12-05: 50 mg via INTRAVENOUS
  Administered 2019-12-05: 200 mg via INTRAVENOUS

## 2019-12-05 MED ORDER — LIDOCAINE HCL (PF) 1 % IJ SOLN
INTRAMUSCULAR | Status: AC
  Filled 2019-12-05: qty 5

## 2019-12-05 MED ORDER — MIDAZOLAM HCL 2 MG/2ML IJ SOLN
INTRAMUSCULAR | Status: AC
  Administered 2019-12-05: 1 mg via INTRAVENOUS
  Filled 2019-12-05: qty 2

## 2019-12-05 MED ORDER — PROMETHAZINE HCL 25 MG/ML IJ SOLN: 6.2500 mg | INTRAMUSCULAR | Status: DC | PRN

## 2019-12-05 MED ORDER — ROCURONIUM BROMIDE 100 MG/10ML IV SOLN
INTRAVENOUS | Status: DC | PRN
  Administered 2019-12-05: 50 mg via INTRAVENOUS

## 2019-12-05 MED ORDER — ONDANSETRON HCL 4 MG/2ML IJ SOLN
INTRAMUSCULAR | Status: DC | PRN
  Administered 2019-12-05: 4 mg via INTRAVENOUS

## 2019-12-05 MED ORDER — ONDANSETRON HCL 4 MG PO TABS: 4.0000 mg | ORAL_TABLET | Freq: Three times a day (TID) | ORAL | 0 refills | Status: AC | PRN

## 2019-12-05 MED ORDER — GLYCOPYRROLATE 0.2 MG/ML IJ SOLN
INTRAMUSCULAR | Status: DC | PRN
  Administered 2019-12-05: .2 mg via INTRAVENOUS

## 2019-12-05 SURGICAL SUPPLY — 60 items
ADAPTER IRRIG TUBE 2 SPIKE SOL (ADAPTER) ×4 IMPLANT
ANCHOR SUT BIOC ST 3X145 (Anchor) ×6 IMPLANT
ANCHOR SUT BIOCOMP LK 2.9X12.5 (Anchor) ×1 IMPLANT
BUR RADIUS 4.0X18.5 (BURR) ×2 IMPLANT
BUR RADIUS 5.5 (BURR) ×2 IMPLANT
CANNULA 5.75X7 CRYSTAL CLEAR (CANNULA) ×4 IMPLANT
CANNULA PARTIAL THREAD 2X7 (CANNULA) ×4 IMPLANT
CANNULA TWIST IN 8.25X9CM (CANNULA) IMPLANT
COOLER POLAR GLACIER W/PUMP (MISCELLANEOUS) ×2 IMPLANT
COVER WAND RF STERILE (DRAPES) ×2 IMPLANT
CRADLE LAMINECT ARM (MISCELLANEOUS) ×4 IMPLANT
DEVICE SUCT BLK HOLE OR FLOOR (MISCELLANEOUS) IMPLANT
DRAPE 3/4 80X56 (DRAPES) ×2 IMPLANT
DRAPE IMP U-DRAPE 54X76 (DRAPES) ×4 IMPLANT
DRAPE INCISE IOBAN 66X45 STRL (DRAPES) ×2 IMPLANT
DRAPE SPLIT 6X30 W/TAPE (DRAPES) ×4 IMPLANT
DRAPE U-SHAPE 47X51 STRL (DRAPES) IMPLANT
DURAPREP 26ML APPLICATOR (WOUND CARE) ×6 IMPLANT
ELECT REM PT RETURN 9FT ADLT (ELECTROSURGICAL) ×2
ELECTRODE REM PT RTRN 9FT ADLT (ELECTROSURGICAL) ×1 IMPLANT
GAUZE SPONGE 4X4 12PLY STRL (GAUZE/BANDAGES/DRESSINGS) ×2 IMPLANT
GAUZE XEROFORM 1X8 LF (GAUZE/BANDAGES/DRESSINGS) ×2 IMPLANT
GLOVE BIOGEL PI IND STRL 9 (GLOVE) ×1 IMPLANT
GLOVE BIOGEL PI INDICATOR 9 (GLOVE) ×1
GLOVE SURG 9.0 ORTHO LTXF (GLOVE) ×4 IMPLANT
GOWN STRL REUS TWL 2XL XL LVL4 (GOWN DISPOSABLE) ×2 IMPLANT
IV LACTATED RINGER IRRG 3000ML (IV SOLUTION) ×21
IV LR IRRIG 3000ML ARTHROMATIC (IV SOLUTION) ×6 IMPLANT
KIT INSERTION 2.9 PUSHLOCK (KITS) ×3 IMPLANT
KIT STABILIZATION SHOULDER (MISCELLANEOUS) ×2 IMPLANT
KIT SUTURETAK 3.0 INSERT PERC (KITS) ×1 IMPLANT
KIT TURNOVER KIT A (KITS) ×2 IMPLANT
MANIFOLD NEPTUNE II (INSTRUMENTS) ×2 IMPLANT
MASK FACE SPIDER DISP (MASK) ×2 IMPLANT
MAT ABSORB  FLUID 56X50 GRAY (MISCELLANEOUS) ×2
MAT ABSORB FLUID 56X50 GRAY (MISCELLANEOUS) ×2 IMPLANT
NEEDLE HYPO 22GX1.5 SAFETY (NEEDLE) ×2 IMPLANT
PACK ARTHROSCOPY SHOULDER (MISCELLANEOUS) ×2 IMPLANT
PAD WRAPON POLAR SHDR XLG (MISCELLANEOUS) ×1 IMPLANT
SET TUBE SUCT SHAVER OUTFL 24K (TUBING) ×2 IMPLANT
SET TUBE TIP INTRA-ARTICULAR (MISCELLANEOUS) ×2 IMPLANT
STRAP SAFETY 5IN WIDE (MISCELLANEOUS) ×2 IMPLANT
STRIP CLOSURE SKIN 1/2X4 (GAUZE/BANDAGES/DRESSINGS) ×2 IMPLANT
SUT ETHILON 4-0 (SUTURE) ×1
SUT ETHILON 4-0 FS2 18XMFL BLK (SUTURE) ×1
SUT LASSO 90 DEG SD STR (SUTURE) ×1 IMPLANT
SUT MNCRL 4-0 (SUTURE) ×1
SUT MNCRL 4-0 27XMFL (SUTURE) ×1
SUT PDS AB 0 CT1 27 (SUTURE) IMPLANT
SUT VIC AB 0 CT1 36 (SUTURE) IMPLANT
SUT VIC AB 2-0 CT2 27 (SUTURE) IMPLANT
SUTURE ETHLN 4-0 FS2 18XMF BLK (SUTURE) ×1 IMPLANT
SUTURE MNCRL 4-0 27XMF (SUTURE) ×1 IMPLANT
SUTURE TAPE FIBERLINK 1.3 LOOP (SUTURE) IMPLANT
SUTURETAPE FIBERLINK 1.3 LOOP (SUTURE) ×2
TAPE MICROFOAM 4IN (TAPE) ×2 IMPLANT
TUBING ARTHRO INFLOW-ONLY STRL (TUBING) ×2 IMPLANT
TUBING CONNECTING 10 (TUBING) ×2 IMPLANT
WAND HAND CNTRL MULTIVAC 90 (MISCELLANEOUS) ×2 IMPLANT
WRAPON POLAR PAD SHDR XLG (MISCELLANEOUS) ×2

## 2019-12-05 NOTE — Op Note (Signed)
12/05/2019  4:45 PM  PATIENT:  Lawrence Flores  64 y.o. male  PRE-OPERATIVE DIAGNOSIS: Circumferential right shoulder labral tear, possible biceps tendon tear  POST-OPERATIVE DIAGNOSIS:   1.  Circumferential right shoulder labral tear 2.  Biceps tendon high-grade partial-thickness tear off the superior labrum 3.  Chondrosis of the right humeral head 4.  Subacromial impingement with bursitis 5.  AC joint arthrosis 6.  Anterior shoulder adhesions and capsular thickening  PROCEDURE:  Procedure(s): RIGHT SHOULDER ARTHROSCOPIC CIRCUMFERENTIAL LABRAL REPAIR, SUBACROMIAL DECOMPRESSION, DISTAL CLAVICLE EXCISION, BICEPS TENODESIS AND LYSIS OF ADHESIONS/CAPSULOTOMY, CHONDROPLASTY OF THE HUMERAL HEAD  SURGEON:  Surgeon(s) and Role:    Thornton Park, MD - Primary  ANESTHESIA:   general and paracervical block   PREOPERATIVE INDICATIONS:  Lawrence Flores is a  64 y.o. male with a diagnosis of a circumferential labral tear of right shoulder who failed conservative measures and elected for surgical management.  His preoperative MRI confirmed a circumferential labral tear and suggested a tear of the long head of the biceps tendon.  I discussed the risks and benefits of surgery. The risks include but are not limited to infection, bleeding, nerve or blood vessel injury, joint stiffness or loss of motion, persistent pain, weakness or instability, and hardware failure and the need for further surgery. Medical risks include but are not limited to DVT and pulmonary embolism, myocardial infarction, stroke, pneumonia, respiratory failure and death. Patient understood these risks and wished to proceed.   OPERATIVE IMPLANTS: Arthrex bio suturetak anchors  7 for labral fixation, Arthrex Loop n' Tack biceps tenodesis system with Pushlock anchor x1 for biceps tenodesis  OPERATIVE FINDINGS: Right shoulder circumferential labral tear, high-grade partial thickness tear of the biceps tendon at the junction with  the superior labrum.  Patient also had adhesions and capsular thickening of the anterior capsule.  Patient had chondromalacia of the humeral head without evidence of a Hill-Sachs lesion.  OPERATIVE PROCEDURE:  I met with the patient in the preoperative area.  A preop history and physical was performed at the bedside.  I signed the right shoulder according the hospital's correct site of surgery protocol.  I answered all questions by the patient. Patient had an interscalene block of the right shoulder with Exparel by the anesthesia service in the preoperative area.  He was then brought to the operating room where he underwent general endotracheal intubation and then positioned in a beachchair position. All bony prominences were adequately padded including the lower extremities.  Examination under anesthesia revealed forward elevation of approximately 140 degrees.  Patient had external rotation in abduction of approximately 70 to 80 degrees but no increased laxity with load and shift testing.   Patient had a negative sulcus sign.  The patient was then prepped and draped in a sterile fashion. The patient received 2 g of Ancef prior to the onset of the case.  A timeout was performed to verify the patient's name, date of birth, medical record number, correct site of surgery and correct procedure to be performed.The timeout was also used to verify the patient received antibiotics that all appropriate instruments, implants and radiographic studies were available in the room. Once all in attendance were in agreement case began.  Bony landmarks were drawn out with a surgical marker along with proposed incisions.  An 11 blade was used to establish a posterior portal through which the arthroscope was placed in the glenohumeral joint. An anterior portal was established under direct visualization using an 18-gauge spinal needle for  localization. A 5.75 mm arthroscopic cannula was inserted through the anterior portal. A  full diagnostic examination of the glenohumeral joint was performed.  An anterolateral portal was established again under direct visualization using an 18-gauge spinal needle. A lysis of adhesions and anterior capsulotomy was performed with a 90 degree ArthroCare wand.  The humeral head was found to have chondrosis and a chondroplasty of the humeral head was performed using a 4.0 mm resector shaver blade.  An Arthrex loop and tack system was then used to perform an arthroscopic biceps tenodesis.  A luggage tag suture was placed through the biceps tendon.  It was then released from the superior labrum with a 90 degree ArthroCare wand.  A drill hole was then placed at the top of the intertuberous groove and the arthroscopic biceps tenodesis was completed with a single Arthrex push lock anchor.  An anterior lateral portal was then established with a 7 mm arthroscopic cannula.  The 5.75 mm arthroscopic cannula in the anterior portal was then replaced with a 7 mm one.  The anterior labrum was then mobilized with an arthroscopic periosteal elevator.  The interval between the osseous glenoid and anterior labrum was then debrided with a rasp and 4.0 mm resector shaver blade until punctate bleeding was identified.  Three Arthrex Bio Suturetak anchors were then placed in succession at the 3, 4 and 5:00 positions.  A 90 degree Arthrex suture lasso was used to shuttle a single limb of each of these anchor under the anterior inferior labrum.  An arthroscopic knot tying technique was then used approximate the anterior inferior labrum to the osseous glenoid to complete the capsulorrhaphy anteriorly.  The attention was then turned to the superior labrum.  A single Arthrex bio suturetack anchor was placed into the anterior superior glenoid (between the 12:00 and 1 o'clock position) after the superior glenoid was prepped with a 4.0 mm resector shaver blade until punctate bleeding was identified on the nonarticular portion of  the superior glenoid.  Again a single limb of this anchor was shuttled under the superior anterior labrum using a 90 degree suture lasso.  An arthroscopic knot tying technique was used to reduce the labrum.    The arthroscope was switched to the anterior portal and a 7 mm cannula was placed through the the posterior portal using switching sticks.  The Arthrex percutaneous system was used to position the drill guide to allow for placement of superior posterior anchors.  Two bioSuturetak anchors were placed at approximately the 11:00 and 9:00 positions using the percutaneous drill guide.  90 degree lasso was used to shuttle a single limb of each anchor from the posterior portal.  Finally, the Arthrex percutaneous drill guide system was used to place a posterior inferior anchor at approximately the 7 o'clock position.  The suture lasso was again placed through the posterior portal to shuttle the limb of this anchor under the posterior inferior labrum.  Arthroscopic knot tying technique was used to approximate the posterior inferior labrum to the osseous glenoid.  The repair was probed for stability.  Final arthroscopic images of the repair were taken.  The glenohumeral joint was then copiously irrigated.  All arthroscopic instruments were removed.   The scope was then placed in the subacromial space.  Extensive bursitis was encountered.  The lateral portal was established under direct visualization and a subacromial debridement was performed using a 4.0 mm resector shaver blade and 90 degree ArthroCare wand.  A 5.5 mm resector shaver blade was  then used to perform a subacromial decompression through the lateral portal.  The 5.5 mm resector shaver blade was then placed to the anterior portal and a distal clavicle excision was performed for [facility] joint arthrosis.  The subacromial space was copiously irrigated and lavaged to remove all osseous debris.  All arthroscopic instruments were then removed.  The arthroscopic  portals were closed with 4-0 nylon. A dry, sterile dressing was applied to the right shoulder, along with a Polar Care sleeve. Patient's right arm was then placed in an abduction sling. He was awoken and brought to PACU in stable condition. I was scrubbed and present for the entire case and all sharp, sponge and instrument counts were correct at the conclusion the case.  The patient's wife was not in the waiting room.  I was given a phone number from the PACU staff to call the patient's wife.  I left a message on voicemail to let her know the procedure was performed successfully and without complication and her husband was stable in the recovery room.     Timoteo Gaul, MD

## 2019-12-05 NOTE — Progress Notes (Signed)
Blood sugar 248 no new orders from dr Rosey Bath

## 2019-12-05 NOTE — Transfer of Care (Signed)
Immediate Anesthesia Transfer of Care Note  Patient: Lawrence Flores  Procedure(s) Performed: RIGHT SHOULDER ARTHROSCOPIC LABRAL REPAIR, SUBACROMIAL DECOMPRESSION, DISTAL CLAVICLE EXCISION, BICEPS TENODESIS (Right Shoulder)  Patient Location: PACU  Anesthesia Type:General  Level of Consciousness: awake, alert  and oriented  Airway & Oxygen Therapy: Patient Spontanous Breathing and Patient connected to face mask oxygen  Post-op Assessment: Report given to RN and Post -op Vital signs reviewed and stable  Post vital signs: Reviewed and stable  Last Vitals:  Vitals Value Taken Time  BP 112/65 12/05/19 1147  Temp 36.1 C 12/05/19 1147  Pulse 58 12/05/19 1148  Resp 17 12/05/19 1148  SpO2 98 % 12/05/19 1148  Vitals shown include unvalidated device data.  Last Pain:  Vitals:   12/05/19 1147  TempSrc: Temporal  PainSc:          Complications: No apparent anesthesia complications

## 2019-12-05 NOTE — Discharge Instructions (Signed)

## 2019-12-05 NOTE — Anesthesia Preprocedure Evaluation (Addendum)
Anesthesia Evaluation  Patient identified by MRN, date of birth, ID band Patient awake    Reviewed: Allergy & Precautions, H&P , NPO status , Patient's Chart, lab work & pertinent test results, reviewed documented beta blocker date and time   History of Anesthesia Complications Negative for: history of anesthetic complications  Airway Mallampati: IV  TM Distance: >3 FB Neck ROM: full    Dental  (+) Dental Advidsory Given, Caps, Teeth Intact   Pulmonary neg pulmonary ROS, former smoker,    Pulmonary exam normal        Cardiovascular Exercise Tolerance: Good hypertension, (-) angina(-) Past MI and (-) Cardiac Stents + dysrhythmias (now s/p ablation) Atrial Fibrillation (-) Valvular Problems/Murmurs Rhythm:regular Rate:Normal     Neuro/Psych negative neurological ROS  negative psych ROS   GI/Hepatic negative GI ROS, Neg liver ROS,   Endo/Other  diabetes  Renal/GU negative Renal ROS  negative genitourinary   Musculoskeletal   Abdominal   Peds  Hematology negative hematology ROS (+)   Anesthesia Other Findings Past Medical History: No date: Cancer ([place])     Comment:  SKIN CANCER, PROSTATE No date: Dysrhythmia     Comment:  ATRIAL FLUTTER, RIGHT BUNDLE BRANCH BLOCK No date: Hyperlipidemia associated with type 2 diabetes mellitus (HCC) No date: Hypertension   Reproductive/Obstetrics negative OB ROS                             Anesthesia Physical Anesthesia Plan  ASA: II  Anesthesia Plan: General   Post-op Pain Management:    Induction: Intravenous  PONV Risk Score and Plan: 2 and Promethazine, Propofol infusion, TIVA and Midazolam  Airway Management Planned: Oral ETT  Additional Equipment:   Intra-op Plan:   Post-operative Plan: Extubation in OR  Informed Consent: I have reviewed the patients History and Physical, chart, labs and discussed the procedure including the risks,  benefits and alternatives for the proposed anesthesia with the patient or authorized representative who has indicated his/her understanding and acceptance.     Dental Advisory Given  Plan Discussed with: Anesthesiologist, CRNA and Surgeon  Anesthesia Plan Comments:        Anesthesia Quick Evaluation

## 2019-12-05 NOTE — Anesthesia Procedure Notes (Signed)
Procedure Name: Intubation Date/Time: 12/05/2019 8:09 AM Performed by: Johnna Acosta, CRNA Pre-anesthesia Checklist: Patient identified, Emergency Drugs available, Suction available, Patient being monitored and Timeout performed Patient Re-evaluated:Patient Re-evaluated prior to induction Oxygen Delivery Method: Circle system utilized Preoxygenation: Pre-oxygenation with 100% oxygen Induction Type: IV induction Ventilation: Mask ventilation with difficulty and Oral airway inserted - appropriate to patient size Laryngoscope Size: McGraph and 3 Grade View: Grade II Tube type: Oral Airway Equipment and Method: Stylet and Video-laryngoscopy Placement Confirmation: ETT inserted through vocal cords under direct vision,  positive ETCO2 and breath sounds checked- equal and bilateral Secured at: 22 cm Tube secured with: Tape Dental Injury: Teeth and Oropharynx as per pre-operative assessment  Difficulty Due To: Difficulty was anticipated and Difficult Airway- due to limited oral opening

## 2019-12-05 NOTE — H&P (Signed)
PREOPERATIVE H&P  Chief Complaint: Circumferential labral tear of the right shoulder HPI: Lawrence Flores is a 64 y.o. male who presents for preoperative history and physical with a diagnosis of circumferential labral tear of right shoulder. Symptoms of pain and limitation of motion are significantly impairing activities of daily living.  An MR arthrogram is demonstrated circumferential tearing of the labrum, interstitial tear of the subscapularis, tearing of the long head of the biceps tendon and subacromial impingement and AC joint arthrosis.  Patient has failed nonoperative management including subacromial corticosteroid injections and physical therapy. He has like to to proceed with surgical excision of his labrum.   Past Medical History:  Diagnosis Date  . Cancer (HCC)    SKIN CANCER, PROSTATE  . Dysrhythmia    ATRIAL FLUTTER, RIGHT BUNDLE BRANCH BLOCK  . Hyperlipidemia associated with type 2 diabetes mellitus ([place])   . Hypertension    Past Surgical History:  Procedure Laterality Date  . CARDIAC ELECTROPHYSIOLOGY [place] AND ABLATION  2018  . HERNIA REPAIR     Umbilical hernia repair  . PROSTATECTOMY  2015  . TRIGGER FINGER RELEASE  1980   Social History   Socioeconomic History  . Marital status: Married    Spouse name: Butch Penny  . Number of children: Not on file  . Years of education: Not on file  . Highest education level: Not on file  Occupational History  . Occupation: retired Curator.    Comment: retired  Tobacco Use  . Smoking status: Former Smoker    Packs/day: 2.00    Types: Cigarettes    Quit date: 1995    Years since quitting: 26.2  . Smokeless tobacco: Former Systems developer    Quit date: 1995  Substance and Sexual Activity  . Alcohol use: Not Currently  . Drug use: Never  . Sexual activity: Not on file  Other Topics Concern  . Not on file  Social History Narrative  . Not on file   Social Determinants of Health   Financial Resource Strain:   .  Difficulty of Paying Living Expenses:   Food Insecurity:   . Worried About Charity fundraiser in the Last Year:   . Arboriculturist in the Last Year:   Transportation Needs:   . Film/video editor (Medical):   Marland Kitchen Lack of Transportation (Non-Medical):   Physical Activity:   . Days of Exercise per Week:   . Minutes of Exercise per Session:   Stress:   . Feeling of Stress :   Social Connections:   . Frequency of Communication with Friends and Family:   . Frequency of Social Gatherings with Friends and Family:   . Attends Religious Services:   . Active Member of Clubs or Organizations:   . Attends Archivist Meetings:   Marland Kitchen Marital Status:    History reviewed. No pertinent family history. Allergies  Allergen Reactions  . Contrast Media [Iodinated Diagnostic Agents] Itching   Prior to Admission medications   Medication Sig Start Date End Date Taking? Authorizing Provider  aspirin EC 81 MG tablet Take 81 mg by mouth daily.   Yes [provider]  benazepril (LOTENSIN) 40 MG tablet Take 40 mg by mouth daily.   Yes [provider]  diltiazem (CARDIZEM LA) 240 MG 24 hr tablet Take 240 mg by mouth daily.   Yes [provider]  empagliflozin (JARDIANCE) 10 MG TABS tablet Take 10 mg by mouth daily.   Yes [provider]  ibuprofen (ADVIL) 200 MG tablet Take 400 mg by mouth every 6 (six) hours as needed.   Yes [provider]  metFORMIN (GLUCOPHAGE) 500 MG tablet Take 500 mg by mouth 2 (two) times daily with a meal.   Yes [provider]  metoprolol succinate (TOPROL-XL) 25 MG 24 hr tablet Take 25 mg by mouth daily.   Yes [provider]  rosuvastatin (CRESTOR) 10 MG tablet Take 10 mg by mouth daily.   Yes [provider]  sertraline (ZOLOFT) 100 MG tablet Take 100 mg by mouth daily.   Yes [provider]  traMADol (ULTRAM) 50 MG tablet Take 50 mg by mouth every 6 (six) hours as needed for moderate  pain.   Yes [provider]     Positive ROS: All other systems have been reviewed and were otherwise negative with the exception of those mentioned in the HPI and as above.  Physical Exam: General: Alert, no acute distress Cardiovascular: Regular rate and rhythm, no murmurs rubs or gallops.  No pedal edema Respiratory: Clear to auscultation bilaterally, no wheezes rales or rhonchi. No cyanosis, no use of accessory musculature GI: No organomegaly, abdomen is soft and non-tender nondistended with positive bowel sounds. Skin: Skin intact, no lesions within the operative field. Neurologic: Sensation intact distally Psychiatric: Patient is competent for consent with normal mood and affect Lymphatic: No cervical lymphadenopathy  MUSCULOSKELETAL: Right shoulder: Patient has pain with overhead and abduction activity beyond 90 degrees.  The skin was intact.  There is no erythema ecchymosis swelling or deformity.  Patient has full digital wrist and elbow range of motion, intact/light touch and palpable pedal pulses.  He has pain with impingement testing no apprehension or instability on exam.  Assessment: Circumferential labral tear, right shoulder  Plan: Plan for Procedure(s): RIGHT SHOULDER ATHROSCOPY WITH circumferential labral repair, hospital biceps tenotomy versus tenodesis, subacromial decompression and distal clavicle excision  I reviewed the details of the operation as well as the postoperative course with the patient.  I discussed the risks and benefits of surgery. The risks include but are not limited to infection, bleeding, nerve or blood vessel injury, joint stiffness or loss of motion, persistent pain, weakness or instability, retear of the labrum or failure of the repair and the need for further surgery. Medical risks include but are not limited to DVT and pulmonary embolism, myocardial infarction, stroke, pneumonia, respiratory failure and death. Patient understood these  risks and wished to proceed.     Thornton Park, MD   12/05/2019 7:36 AM

## 2019-12-05 NOTE — Anesthesia Procedure Notes (Signed)
Anesthesia Regional Block: Interscalene brachial plexus block   Pre-Anesthetic Checklist: ,, timeout performed, Correct Patient, Correct Site, Correct Laterality, Correct Procedure, Correct Position, site marked, Risks and benefits discussed,  Surgical consent,  Pre-op evaluation,  At surgeon's request and post-op pain management  Laterality: Right and Upper  Prep: chloraprep       Needles:  Injection technique: Single-shot  Needle Type: Stimiplex     Needle Length: 5cm  Needle Gauge: 22     Additional Needles:   Procedures:,,,, ultrasound used (permanent image in chart),,,,  Narrative:  Start time: 12/05/2019 7:36 AM End time: 12/05/2019 7:33 AM Injection made incrementally with aspirations every 5 mL.  Performed by: Personally  Anesthesiologist: Martha Clan, MD  Additional Notes: Functioning IV was confirmed and monitors were applied.  A 59mm 22ga Stimuplex needle was used. Sterile prep and drape,hand hygiene and sterile gloves were used.  Negative aspiration and negative test dose prior to incremental administration of local anesthetic. The patient tolerated the procedure well.

## 2019-12-06 NOTE — Anesthesia Postprocedure Evaluation (Signed)
Anesthesia Post Note  Patient: Antawn D Siddique  Procedure(s) Performed: RIGHT SHOULDER ARTHROSCOPIC LABRAL REPAIR, SUBACROMIAL DECOMPRESSION, DISTAL CLAVICLE EXCISION, BICEPS TENODESIS (Right Shoulder)  Patient location during evaluation: PACU Anesthesia Type: General Level of consciousness: awake and alert Pain management: pain level controlled Vital Signs Assessment: post-procedure vital signs reviewed and stable Respiratory status: spontaneous breathing, nonlabored ventilation, respiratory function stable and patient connected to nasal cannula oxygen Cardiovascular status: blood pressure returned to baseline and stable Postop Assessment: no apparent nausea or vomiting Anesthetic complications: no     Last Vitals:  Vitals:   12/05/19 1252 12/05/19 1339  BP: 114/71 116/73  Pulse: (!) 54 (!) 51  Resp: 16 18  Temp:    SpO2: 91% 93%    Last Pain:  Vitals:   12/05/19 1252  TempSrc: Temporal  PainSc: 0-No pain                 Martha Clan

## 2020-03-08 ENCOUNTER — Encounter: Payer: Self-pay | Admitting: Orthopedic Surgery

## 2020-05-08 ENCOUNTER — Encounter: Payer: Self-pay | Admitting: Orthopedic Surgery

## 2020-10-23 ENCOUNTER — Encounter: Payer: Self-pay | Admitting: Orthopedic Surgery

## 2020-11-05 ENCOUNTER — Other Ambulatory Visit: Payer: Self-pay | Admitting: Internal Medicine

## 2020-11-05 ENCOUNTER — Other Ambulatory Visit (HOSPITAL_COMMUNITY): Payer: Self-pay | Admitting: Internal Medicine

## 2020-11-05 DIAGNOSIS — N451 Epididymitis: Secondary | ICD-10-CM

## 2020-11-05 DIAGNOSIS — N50812 Left testicular pain: Secondary | ICD-10-CM

## 2020-11-13 ENCOUNTER — Ambulatory Visit
Admission: RE | Admit: 2020-11-13 | Discharge: 2020-11-13 | Disposition: A | Discharge status: Home / Self Care | Payer: Medicare Other | Source: Ambulatory Visit | Attending: Internal Medicine | Admitting: Internal Medicine

## 2020-11-13 ENCOUNTER — Other Ambulatory Visit: Payer: Self-pay

## 2020-11-13 DIAGNOSIS — N451 Epididymitis: Secondary | ICD-10-CM | POA: Diagnosis not present

## 2020-11-13 DIAGNOSIS — N50812 Left testicular pain: Secondary | ICD-10-CM | POA: Diagnosis present

## 2021-03-10 ENCOUNTER — Encounter: Payer: Self-pay | Admitting: Orthopedic Surgery

## 2021-07-22 ENCOUNTER — Other Ambulatory Visit: Payer: Self-pay | Admitting: Physical Medicine & Rehabilitation

## 2021-07-22 DIAGNOSIS — M542 Cervicalgia: Secondary | ICD-10-CM

## 2021-07-22 DIAGNOSIS — G8929 Other chronic pain: Secondary | ICD-10-CM

## 2021-07-27 ENCOUNTER — Ambulatory Visit
Admission: RE | Admit: 2021-07-27 | Discharge: 2021-07-27 | Disposition: A | Discharge status: Home / Self Care | Payer: Medicare Other | Source: Ambulatory Visit | Attending: Physical Medicine & Rehabilitation | Admitting: Physical Medicine & Rehabilitation

## 2021-07-27 ENCOUNTER — Other Ambulatory Visit: Payer: Self-pay

## 2021-07-27 DIAGNOSIS — M5442 Lumbago with sciatica, left side: Secondary | ICD-10-CM | POA: Diagnosis present

## 2021-07-27 DIAGNOSIS — G8929 Other chronic pain: Secondary | ICD-10-CM | POA: Insufficient documentation

## 2021-07-27 DIAGNOSIS — M542 Cervicalgia: Secondary | ICD-10-CM | POA: Insufficient documentation

## 2021-07-27 DIAGNOSIS — M5441 Lumbago with sciatica, right side: Secondary | ICD-10-CM | POA: Insufficient documentation

## 2022-05-04 ENCOUNTER — Encounter: Payer: Self-pay | Admitting: Dermatology

## 2022-05-04 ENCOUNTER — Ambulatory Visit (INDEPENDENT_AMBULATORY_CARE_PROVIDER_SITE_OTHER): Payer: Medicare Other | Admitting: Dermatology

## 2022-05-04 DIAGNOSIS — C4491 Basal cell carcinoma of skin, unspecified: Secondary | ICD-10-CM

## 2022-05-04 DIAGNOSIS — Z85828 Personal history of other malignant neoplasm of skin: Secondary | ICD-10-CM

## 2022-05-04 DIAGNOSIS — L578 Other skin changes due to chronic exposure to nonionizing radiation: Secondary | ICD-10-CM | POA: Diagnosis not present

## 2022-05-04 DIAGNOSIS — Z1283 Encounter for screening for malignant neoplasm of skin: Secondary | ICD-10-CM | POA: Diagnosis not present

## 2022-05-04 DIAGNOSIS — C44612 Basal cell carcinoma of skin of right upper limb, including shoulder: Secondary | ICD-10-CM | POA: Diagnosis not present

## 2022-05-04 DIAGNOSIS — Z86006 Personal history of melanoma in-situ: Secondary | ICD-10-CM

## 2022-05-04 DIAGNOSIS — D225 Melanocytic nevi of trunk: Secondary | ICD-10-CM

## 2022-05-04 DIAGNOSIS — D239 Other benign neoplasm of skin, unspecified: Secondary | ICD-10-CM

## 2022-05-04 DIAGNOSIS — L821 Other seborrheic keratosis: Secondary | ICD-10-CM

## 2022-05-04 DIAGNOSIS — L814 Other melanin hyperpigmentation: Secondary | ICD-10-CM

## 2022-05-04 DIAGNOSIS — D485 Neoplasm of uncertain behavior of skin: Secondary | ICD-10-CM

## 2022-05-04 DIAGNOSIS — Z79899 Other long term (current) drug therapy: Secondary | ICD-10-CM

## 2022-05-04 DIAGNOSIS — D1800 Hemangioma unspecified site: Secondary | ICD-10-CM

## 2022-05-04 DIAGNOSIS — L570 Actinic keratosis: Secondary | ICD-10-CM

## 2022-05-04 DIAGNOSIS — L918 Other hypertrophic disorders of the skin: Secondary | ICD-10-CM

## 2022-05-04 DIAGNOSIS — D229 Melanocytic nevi, unspecified: Secondary | ICD-10-CM

## 2022-05-04 HISTORY — DX: Basal cell carcinoma of skin, unspecified: C44.91

## 2022-05-04 HISTORY — DX: Other benign neoplasm of skin, unspecified: D23.9

## 2022-05-04 MED ORDER — FLUOROURACIL 5 % EX CREA: TOPICAL_CREAM | Freq: Two times a day (BID) | CUTANEOUS | 0 refills | Status: AC

## 2022-05-04 NOTE — Patient Instructions (Addendum)
Wound Care Instructions  Cleanse wound gently with soap and water once a day then pat dry with clean gauze. Apply a thin coat of Petrolatum (petroleum jelly, "Vaseline") over the wound (unless you have an allergy to this). We recommend that you use a new, sterile tube of Vaseline. Do not pick or remove scabs. Do not remove the yellow or white "healing tissue" from the base of the wound.  Cover the wound with fresh, clean, nonstick gauze and secure with paper tape. You may use Band-Aids in place of gauze and tape if the wound is small enough, but would recommend trimming much of the tape off as there is often too much. Sometimes Band-Aids can irritate the skin.  You should call the office for your biopsy report after 1 week if you have not already been contacted.  If you experience any problems, such as abnormal amounts of bleeding, swelling, significant bruising, significant pain, or evidence of infection, please call the office immediately.  FOR ADULT SURGERY PATIENTS: If you need something for pain relief you may take 1 extra strength Tylenol (acetaminophen) AND 2 Ibuprofen ('200mg'$  each) together every 4 hours as needed for pain. (do not take these if you are allergic to them or if you have a reason you should not take them.) Typically, you may only need pain medication for 1 to 3 days.   Cryotherapy Aftercare  Wash gently with soap and water everyday.   Apply Vaseline and Band-Aid daily until healed.    Starting September 15th- Start 5-fluorouracil/calcipotriene cream twice a day for 7 days to affected areas including scalp. Prescription sent to [facility]. Patient provided with contact information for pharmacy and advised the pharmacy will mail the prescription to their home. Patient provided with handout reviewing treatment course and side effects and advised to call or message [place] on MyChart with any concerns.   Melanoma ABCDEs  Melanoma is the most dangerous type of skin cancer,  and is the leading cause of death from skin disease.  You are more likely to develop melanoma if you: Have light-colored skin, light-colored eyes, or red or blond hair Spend a lot of time in the sun Tan regularly, either outdoors or in a tanning bed Have had blistering sunburns, especially during childhood Have a close family member who has had a melanoma Have atypical moles or large birthmarks  Early detection of melanoma is key since treatment is typically straightforward and cure rates are extremely high if we catch it early.   The first sign of melanoma is often a change in a mole or a new dark spot.  The ABCDE system is a way of remembering the signs of melanoma.  A for asymmetry:  The two halves do not match. B for border:  The edges of the growth are irregular. C for color:  A mixture of colors are present instead of an even brown color. D for diameter:  Melanomas are usually (but not always) greater than 67m - the size of a pencil eraser. E for evolution:  The spot keeps changing in size, shape, and color.  Please check your skin once per month between visits. You can use a small mirror in front and a large mirror behind you to keep an eye on the back side or your body.   If you see any new or changing lesions before your next follow-up, please call to schedule a visit.  Please continue daily skin protection including broad spectrum sunscreen SPF 30+ to sun-exposed  areas, reapplying every 2 hours as needed when you're outdoors.    Due to recent changes in healthcare laws, you may see results of your pathology and/or laboratory studies on MyChart before the doctors have had a chance to review them. We understand that in some cases there may be results that are confusing or concerning to you. Please understand that not all results are received at the same time and often the doctors may need to interpret multiple results in order to provide you with the best plan of care or course of  treatment. Therefore, we ask that you please give [place] 2 business days to thoroughly review all your results before contacting the office for clarification. Should we see a critical lab result, you will be contacted sooner.   If You Need Anything After Your Visit  If you have any questions or concerns for your doctor, please call our main line at [phone] and press option 4 to reach your doctor's medical assistant. If no one answers, please leave a voicemail as directed and we will return your call as soon as possible. Messages left after 4 pm will be answered the following business day.   You may also send [place] a message via [place]. We typically respond to MyChart messages within 1-2 business days.  For prescription refills, please ask your pharmacy to contact our office. Our fax number is [phone].  If you have an urgent issue when the clinic is closed that cannot wait until the next business day, you can page your doctor at the number below.    Please note that while we do our best to be available for urgent issues outside of office hours, we are not available 24/7.   If you have an urgent issue and are unable to reach [place], you may choose to seek medical care at your doctor's office, retail clinic, urgent care center, or emergency room.  If you have a medical emergency, please immediately call 911 or go to the emergency department.  Pager Numbers  - Dr. Nehemiah Massed: [phone]  - Dr. Laurence Ferrari: [phone]  - Dr. Nicole Kindred: [phone]  In the event of inclement weather, please call our main line at [phone] for an update on the status of any delays or closures.  Dermatology Medication Tips: Please keep the boxes that topical medications come in in order to help keep track of the instructions about where and how to use these. Pharmacies typically print the medication instructions only on the boxes and not directly on the medication tubes.   If your medication is too expensive,  please contact our office at [phone] option 4 or send [place] a message through [place].   We are unable to tell what your co-pay for medications will be in advance as this is different depending on your insurance coverage. However, we may be able to find a substitute medication at lower cost or fill out paperwork to get insurance to cover a needed medication.   If a prior authorization is required to get your medication covered by your insurance company, please allow [place] 1-2 business days to complete this process.  Drug prices often vary depending on where the prescription is filled and some pharmacies may offer cheaper prices.  The website www.goodrx.com contains coupons for medications through different pharmacies. The prices here do not account for what the cost may be with help from insurance (it may be cheaper with your insurance), but the website can give you the price if you did not use  any insurance.  - You can print the associated coupon and take it with your prescription to the pharmacy.  - You may also stop by our office during regular business hours and pick up a GoodRx coupon card.  - If you need your prescription sent electronically to a different pharmacy, notify our office through [facility] or by phone at [phone] option 4.     Si Usted Necesita Algo Despus de Su Visita  Tambin puede enviarnos un mensaje a travs de Pharmacist, community. Por lo general respondemos a los mensajes de MyChart en el transcurso de 1 a 2 das hbiles.  Para renovar recetas, por favor pida a su farmacia que se ponga en contacto con nuestra oficina. Harland Dingwall de fax es [place] [phone].  Si tiene un asunto urgente cuando la clnica est cerrada y que no puede esperar hasta el siguiente da hbil, puede llamar/localizar a su doctor(a) al nmero que aparece a continuacin.   Por favor, tenga en cuenta que aunque hacemos todo lo posible para estar disponibles para asuntos urgentes fuera del  horario de [place], no estamos disponibles las 24 horas del da, los 7 das de la [place].   Si tiene un problema urgente y no puede comunicarse con nosotros, puede optar por buscar atencin mdica  en el consultorio de su doctor(a), en una clnica privada, en un centro de atencin urgente o en una sala de emergencias.  Si tiene Engineering geologist, por favor llame inmediatamente al 911 o vaya a la sala de emergencias.  Nmeros de bper  - Dr. Nehemiah Massed: [phone]  - Dra. Moye: [phone]  - Dra. Nicole Kindred: [phone]  En caso de inclemencias del [place], por favor llame a Johnsie Kindred principal al [phone] para una actualizacin sobre el [place] de cualquier retraso o cierre.  Consejos para la medicacin en dermatologa: Por favor, guarde las cajas en las que vienen los medicamentos de uso tpico para ayudarle a seguir las instrucciones sobre dnde y cmo usarlos. Las farmacias generalmente imprimen las instrucciones del medicamento slo en las cajas y no directamente en los tubos del [place].   Si su medicamento es muy caro, por favor, pngase en contacto con Zigmund Daniel llamando al [phone] y presione la opcin 4 o envenos un mensaje a travs de Pharmacist, community.   No podemos decirle cul ser su copago por los medicamentos por adelantado ya que esto es diferente dependiendo de la cobertura de su seguro. Sin embargo, es posible que podamos encontrar un medicamento sustituto a Electrical engineer un formulario para que el seguro cubra el medicamento que se considera necesario.   Si se requiere una autorizacin previa para que su compaa de seguros [place] su medicamento, por favor permtanos de 1 a 2 das hbiles para completar este proceso.  Los precios de los medicamentos varan con frecuencia dependiendo del Environmental consultant de dnde se surte la receta y alguna farmacias pueden ofrecer precios ms baratos.  El sitio web www.goodrx.com tiene cupones para medicamentos de Office manager. Los precios aqu no tienen en cuenta lo que podra costar con la ayuda del seguro (puede ser ms barato con su seguro), pero el sitio web puede darle el precio si no utiliz Research scientist (physical sciences).  - Puede imprimir el cupn correspondiente y llevarlo con su receta a la farmacia.  - Tambin puede pasar por nuestra oficina durante el horario de atencin regular y Charity fundraiser una tarjeta de cupones de GoodRx.  - Si necesita que su receta se enve electrnicamente a  una farmacia diferente, informe a nuestra oficina a travs de MyChart de [place] o por telfono llamando al [phone] y presione la opcin 4.

## 2022-05-04 NOTE — Progress Notes (Signed)
New Patient Visit  Subjective  Lawrence Flores is a 66 y.o. male who presents for the following: FBSE (The patient presents for Total-Body Skin Exam (TBSE) for skin cancer screening and mole check.  The patient has spots, moles and lesions to be evaluated, some may be new or changing and the patient has concerns that these could be cancer. 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 Patient referred for spot at back that PCP is concerned about. //).  Patient accompanied by wife who contributes to history.   The following portions of the chart were reviewed this encounter and updated as appropriate:   Tobacco  Allergies  Meds  Problems  Med Hx  Surg Hx  Fam Hx     Review of Systems:  No other skin or systemic complaints except as noted in HPI or Assessment and Plan.  Objective  Well appearing patient in no apparent distress; mood and affect are within normal limits.  A full examination was performed including scalp, head, eyes, ears, nose, lips, neck, chest, axillae, abdomen, back, buttocks, bilateral upper extremities, bilateral lower extremities, hands, feet, fingers, toes, fingernails, and toenails. All findings within normal limits unless otherwise noted below.  mid to upper back 0.2 cm dark brown macule      right dorsum hand ulceration     Scalp (8) Erythematous thin papules/macules with gritty scale.    Assessment & Plan  Neoplasm of uncertain behavior of skin (2) mid to upper back  Epidermal / dermal shaving  Lesion diameter (cm):  0.2 Informed consent: discussed and consent obtained   Timeout: patient name, date of birth, surgical site, and procedure verified   Procedure prep:  Patient was prepped and draped in usual sterile fashion Prep type:  Isopropyl alcohol Anesthesia: the lesion was anesthetized in a standard fashion   Anesthetic:  1% lidocaine w/ epinephrine 1-100,000 buffered w/ 8.4% NaHCO3 Instrument used:  flexible razor blade   Hemostasis achieved with: pressure, aluminum chloride and electrodesiccation   Outcome: patient tolerated procedure well   Post-procedure details: sterile dressing applied and wound care instructions given   Dressing type: bandage and petrolatum    Specimen 1 - Surgical pathology Differential Diagnosis: Nevus vs dysplastic  Check Margins: No 0.2 cm dark brown macule  right dorsum hand  Skin / nail biopsy Type of biopsy: tangential   Informed consent: discussed and consent obtained   Timeout: patient name, date of birth, surgical site, and procedure verified   Procedure prep:  Patient was prepped and draped in usual sterile fashion Prep type:  Isopropyl alcohol Anesthesia: the lesion was anesthetized in a standard fashion   Anesthetic:  1% lidocaine w/ epinephrine 1-100,000 buffered w/ 8.4% NaHCO3 Instrument used: flexible razor blade   Hemostasis achieved with: pressure, aluminum chloride and electrodesiccation   Outcome: patient tolerated procedure well   Post-procedure details: sterile dressing applied and wound care instructions given   Dressing type: bandage and petrolatum    Specimen 2 - Surgical pathology Differential Diagnosis: Ulcer r/o CA  Check Margins: No ulceration  AK (actinic keratosis) (8) Scalp  Actinic keratoses are precancerous spots that appear secondary to cumulative UV radiation exposure/sun exposure over time. They are chronic with expected duration over 1 year. A portion of actinic keratoses will progress to squamous cell carcinoma of the skin. It is not possible to reliably predict which spots will progress to skin cancer and so treatment is recommended to prevent  development of skin cancer.  Recommend daily broad spectrum sunscreen SPF 30+ to sun-exposed areas, reapply every 2 hours as needed.  Recommend staying in the shade or wearing long sleeves, sun glasses (UVA+UVB protection) and wide brim hats (4-inch brim around the  entire circumference of the hat). Call for new or changing lesions.   Destruction of lesion - Scalp Complexity: simple   Destruction method: cryotherapy   Informed consent: discussed and consent obtained   Timeout:  patient name, date of birth, surgical site, and procedure verified Lesion destroyed using liquid nitrogen: Yes   Region frozen until ice ball extended beyond lesion: Yes   Outcome: patient tolerated procedure well with no complications   Post-procedure details: wound care instructions given    Lentigines - Scattered tan macules - Due to sun exposure - Benign-appearing, observe - Recommend daily broad spectrum sunscreen SPF 30+ to sun-exposed areas, reapply every 2 hours as needed. - Call for any changes  Seborrheic Keratoses - Stuck-on, waxy, tan-brown papules and/or plaques  - Benign-appearing - Discussed benign etiology and prognosis. - Observe - Call for any changes  Melanocytic Nevi - Tan-brown and/or pink-flesh-colored symmetric macules and papules - Benign appearing on exam today - Observation - Call clinic for new or changing moles - Recommend daily use of broad spectrum spf 30+ sunscreen to sun-exposed areas.   Hemangiomas - Red papules - Discussed benign nature - Observe - Call for any changes  Actinic Damage - Severe, confluent actinic changes with pre-cancerous actinic keratoses  - Severe, chronic, not at goal, secondary to cumulative UV radiation exposure over time - diffuse scaly erythematous macules and papules with underlying dyspigmentation - Discussed Prescription "Field Treatment" for Severe, Chronic Confluent Actinic Changes with Pre-Cancerous Actinic Keratoses Field treatment involves treatment of an entire area of skin that has confluent Actinic Changes (Sun/ Ultraviolet light damage) and PreCancerous Actinic Keratoses by method of PhotoDynamic Therapy (PDT) and/or prescription Topical Chemotherapy agents such as 5-fluorouracil,  5-fluorouracil/calcipotriene, and/or imiquimod.  The purpose is to decrease the number of clinically evident and subclinical PreCancerous lesions to prevent progression to development of skin cancer by chemically destroying early precancer changes that may or may not be visible.  It has been shown to reduce the risk of developing skin cancer in the treated area. As a result of treatment, redness, scaling, crusting, and open sores may occur during treatment course. One or more than one of these methods may be used and may have to be used several times to control, suppress and eliminate the PreCancerous changes. Discussed treatment course, expected reaction, and possible side effects. - Recommend daily broad spectrum sunscreen SPF 30+ to sun-exposed areas, reapply every 2 hours as needed.  - Staying in the shade or wearing long sleeves, sun glasses (UVA+UVB protection) and wide brim hats (4-inch brim around the entire circumference of the hat) are also recommended. - Call for new or changing lesions.  Starting September 15th - Start 5-fluorouracil/calcipotriene cream twice a day for 7 days to affected areas including scalp. Prescription sent to [facility]. Patient provided with contact information for pharmacy and advised the pharmacy will mail the prescription to their home. Patient provided with handout reviewing treatment course and side effects and advised to call or message [place] on MyChart with any concerns.  Skin cancer screening performed today.  History of Squamous Cell Carcinoma of the Skin - No evidence of recurrence today at upper posterior scalp - No lymphadenopathy - Recommend regular full body skin exams - Recommend  daily broad spectrum sunscreen SPF 30+ to sun-exposed areas, reapply every 2 hours as needed.  - Call if any new or changing lesions are noted between office visits  History of Melanoma in Situ - No evidence of recurrence today right posterior arm - Recommend regular  full body skin exams - Recommend daily broad spectrum sunscreen SPF 30+ to sun-exposed areas, reapply every 2 hours as needed.  - Call if any new or changing lesions are noted between office visits  Acrochordons (Skin Tags) - Fleshy, skin-colored pedunculated papules - Benign appearing.  - Observe. - If desired, they can be removed with an in office procedure that is not covered by insurance. - Please call the clinic if you notice any new or changing lesions.  Return in about 6 months (around 11/04/2022) for AK follow up.  Graciella Belton, RMA, am acting as scribe for Sarina Ser, MD . Documentation: I have reviewed the above documentation for accuracy and completeness, and I agree with the above.  Sarina Ser, MD

## 2022-05-07 ENCOUNTER — Telehealth: Payer: Self-pay

## 2022-05-07 DIAGNOSIS — C44612 Basal cell carcinoma of skin of right upper limb, including shoulder: Secondary | ICD-10-CM

## 2022-05-07 NOTE — Telephone Encounter (Signed)
Discussed biopsy results, we will send referral for Mohs with Dr Lacinda Axon in [place] today

## 2022-05-07 NOTE — Telephone Encounter (Signed)
-----   Message from Ralene Bathe, MD sent at 05/06/2022  6:23 PM EDT ----- Diagnosis 1. 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 Skin , right dorsum hand BASAL CELL CARCINOMA, NODULAR AND INFILTRATIVE PATTERNS, BASE INVOLVED  1- Moderate dysplastic Recheck next visit 2- Cancer - BCC Schedule for MOHS

## 2022-05-11 ENCOUNTER — Encounter: Payer: Self-pay | Admitting: Dermatology

## 2022-09-17 ENCOUNTER — Telehealth: Payer: Self-pay

## 2022-09-17 NOTE — Telephone Encounter (Signed)
Update specimen tracking and history from [facility] progress notes/photos. aw

## 2022-11-09 ENCOUNTER — Ambulatory Visit (INDEPENDENT_AMBULATORY_CARE_PROVIDER_SITE_OTHER): Payer: Medicare Other | Admitting: Dermatology

## 2022-11-09 ENCOUNTER — Encounter: Payer: Self-pay | Admitting: Dermatology

## 2022-11-09 VITALS — BP 116/74 | HR 87

## 2022-11-09 DIAGNOSIS — L578 Other skin changes due to chronic exposure to nonionizing radiation: Secondary | ICD-10-CM | POA: Diagnosis not present

## 2022-11-09 DIAGNOSIS — L570 Actinic keratosis: Secondary | ICD-10-CM | POA: Diagnosis not present

## 2022-11-09 DIAGNOSIS — Z5111 Encounter for antineoplastic chemotherapy: Secondary | ICD-10-CM | POA: Diagnosis not present

## 2022-11-09 DIAGNOSIS — Z86018 Personal history of other benign neoplasm: Secondary | ICD-10-CM

## 2022-11-09 DIAGNOSIS — Z79899 Other long term (current) drug therapy: Secondary | ICD-10-CM | POA: Diagnosis not present

## 2022-11-09 DIAGNOSIS — Z85828 Personal history of other malignant neoplasm of skin: Secondary | ICD-10-CM

## 2022-11-09 NOTE — Progress Notes (Signed)
Follow-Up Visit   Subjective  Lawrence Flores is a 67 y.o. male who presents for the following: Actinic Keratosis (Hx at scalp , used 5 f/u cream to scalp has a few places to checked at scalp hand and right outer thigh. ). The patient has spots, moles and lesions to be evaluated, some may be new or changing and the patient has concerns that these could be cancer.  The following portions of the chart were reviewed this encounter and updated as appropriate:  Tobacco  Allergies  Meds  Problems  Med Hx  Surg Hx  Fam Hx     Review of Systems: No other skin or systemic complaints except as noted in HPI or Assessment and Plan.  Objective  Well appearing patient in no apparent distress; mood and affect are within normal limits.  A focused examination was performed including scalp, right thigh, right hand. Relevant physical exam findings are noted in the Assessment and Plan.  scalp x 7 (7) Erythematous thin papules/macules with gritty scale.    Assessment & Plan  Actinic keratosis (7) scalp x 7 Restart 17f/ calcipotriene cream twice daily for 7 to 10 day top of scalp  Actinic keratoses are precancerous spots that appear secondary to cumulative UV radiation exposure/sun exposure over time. They are chronic with expected duration over 1 year. A portion of actinic keratoses will progress to squamous cell carcinoma of the skin. It is not possible to reliably predict which spots will progress to skin cancer and so treatment is recommended to prevent development of skin cancer.  Recommend daily broad spectrum sunscreen SPF 30+ to sun-exposed areas, reapply every 2 hours as needed.  Recommend staying in the shade or wearing long sleeves, sun glasses (UVA+UVB protection) and wide brim hats (4-inch brim around the entire circumference of the hat). Call for new or changing lesions.  Destruction of lesion - scalp x 7 Complexity: simple   Destruction method: cryotherapy   Informed consent:  discussed and consent obtained   Timeout:  patient name, date of birth, surgical site, and procedure verified Lesion destroyed using liquid nitrogen: Yes   Region frozen until ice ball extended beyond lesion: Yes   Outcome: patient tolerated procedure well with no complications   Post-procedure details: wound care instructions given   Additional details:  Prior to procedure, discussed risks of blister formation, small wound, skin dyspigmentation, or rare scar following cryotherapy. Recommend Vaseline ointment to treated areas while healing.  Actinic Damage with PreCancerous Actinic Keratoses Counseling for Topical Chemotherapy Management: Patient exhibits: - Severe, confluent actinic changes with pre-cancerous actinic keratoses that is secondary to cumulative UV radiation exposure over time - Condition that is severe; chronic, not at goal. - diffuse scaly erythematous macules and papules with underlying dyspigmentation - Discussed Prescription "Field Treatment" topical Chemotherapy for Severe, Chronic Confluent Actinic Changes with Pre-Cancerous Actinic Keratoses Field treatment involves treatment of an entire area of skin that has confluent Actinic Changes (Sun/ Ultraviolet light damage) and PreCancerous Actinic Keratoses by method of PhotoDynamic Therapy (PDT) and/or prescription Topical Chemotherapy agents such as 5-fluorouracil, 5-fluorouracil/calcipotriene, and/or imiquimod.  The purpose is to decrease the number of clinically evident and subclinical PreCancerous lesions to prevent progression to development of skin cancer by chemically destroying early precancer changes that may or may not be visible.  It has been shown to reduce the risk of developing skin cancer in the treated area. As a result of treatment, redness, scaling, crusting, and open sores may occur during treatment course.  One or more than one of these methods may be used and may have to be used several times to control, suppress  and eliminate the PreCancerous changes. Discussed treatment course, expected reaction, and possible side effects. - Recommend daily broad spectrum sunscreen SPF 30+ to sun-exposed areas, reapply every 2 hours as needed.  - Staying in the shade or wearing long sleeves, sun glasses (UVA+UVB protection) and wide brim hats (4-inch brim around the entire circumference of the hat) are also recommended. - Call for new or changing lesions.  Restart 5-fluorouracil/calcipotriene cream twice a day for 7 - 10 days to affected areas including top of scalp.   Reviewed course of treatment and expected reaction.  Patient advised to expect inflammation and crusting and advised that erosions are possible.  Patient advised to be diligent with sun protection during and after treatment. Counseled to keep medication out of reach of children and pets.  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  - Call if any new or changing lesions are noted between office visits  History of Basal Cell Carcinoma of the Skin Right dorsum hand mohs surgery 09/07/2022 - No evidence of recurrence today - Recommend regular full body skin exams - Recommend daily broad spectrum sunscreen SPF 30+ to sun-exposed areas, reapply every 2 hours as needed.  - Call if any new or changing lesions are noted between office visits  Return in about 6 months (around 05/10/2023) for ak followup .  IRuthell Rummage, CMA, am acting as scribe for Sarina Ser, MD. Documentation: I have reviewed the above documentation for accuracy and completeness, and I agree with the above.  Sarina Ser, MD

## 2022-11-09 NOTE — Patient Instructions (Addendum)
Restart cream twice daily for 7 to 10 day top of scalp   5-fluorouracil/calcipotriene cream twice a day for 7 - 10 days to affected areas including top of scalp. Prescription sent to [facility]. Patient advised they will receive an email to purchase the medication online and have it sent to their home. Patient provided with handout reviewing treatment course and side effects and advised to call or message [place] on MyChart with any concerns.  Reviewed course of treatment and expected reaction.  Patient advised to expect inflammation and crusting and advised that erosions are possible.  Patient advised to be diligent with sun protection during and after treatment. Counseled to keep medication out of reach of children and pets.   5-Fluorouracil/Calcipotriene Patient Education   Actinic keratoses are the dry, red scaly spots on the skin caused by sun damage. A portion of these spots can turn into skin cancer with time, and treating them can help prevent development of skin cancer.   Treatment of these spots requires removal of the defective skin cells. There are various ways to remove actinic keratoses, including freezing with liquid nitrogen, treatment with creams, or treatment with a blue light procedure in the office.   5-fluorouracil cream is a topical cream used to treat actinic keratoses. It works by interfering with the growth of abnormal fast-growing skin cells, such as actinic keratoses. These cells peel off and are replaced by healthy ones.   5-fluorouracil/calcipotriene is a combination of the 5-fluorouracil cream with a vitamin D analog cream called calcipotriene. The calcipotriene alone does not treat actinic keratoses. However, when it is combined with 5-fluorouracil, it helps the 5-fluorouracil treat the actinic keratoses much faster so that the same results can be achieved with a much shorter treatment time.  INSTRUCTIONS FOR 5-FLUOROURACIL/CALCIPOTRIENE CREAM:    5-fluorouracil/calcipotriene cream typically only needs to be used for 4-7 days. A thin layer should be applied twice a day to the treatment areas recommended by your physician.   If your physician prescribed you separate tubes of 5-fluourouracil and calcipotriene, apply a thin layer of 5-fluorouracil followed by a thin layer of calcipotriene.   Avoid contact with your eyes, nostrils, and mouth. Do not use 5-fluorouracil/calcipotriene cream on infected or open wounds.   You will develop redness, irritation and some crusting at areas where you have pre-cancer damage/actinic keratoses. IF YOU DEVELOP PAIN, BLEEDING, OR SIGNIFICANT CRUSTING, STOP THE TREATMENT EARLY - you have already gotten a good response and the actinic keratoses should clear up well.  Wash your hands after applying 5-fluorouracil 5% cream on your skin.   A moisturizer or sunscreen with a minimum SPF 30 should be applied each morning.   Once you have finished the treatment, you can apply a thin layer of Vaseline twice a day to irritated areas to soothe and calm the areas more quickly. If you experience significant discomfort, contact your physician.  For some patients it is necessary to repeat the treatment for best results.  SIDE EFFECTS: When using 5-fluorouracil/calcipotriene cream, you may have mild irritation, such as redness, dryness, swelling, or a mild burning sensation. This usually resolves within 2 weeks. The more actinic keratoses you have, the more redness and inflammation you can expect during treatment. Eye irritation has been reported rarely. If this occurs, please let us know.  If you have any trouble using this cream, please call the office. If you have any other questions about this information, please do not hesitate to ask me before you  leave the office.       Actinic keratoses are precancerous spots that appear secondary to cumulative UV radiation exposure/sun exposure over time. They are chronic  with expected duration over 1 year. A portion of actinic keratoses will progress to squamous cell carcinoma of the skin. It is not possible to reliably predict which spots will progress to skin cancer and so treatment is recommended to prevent development of skin cancer.  Recommend daily broad spectrum sunscreen SPF 30+ to sun-exposed areas, reapply every 2 hours as needed.  Recommend staying in the shade or wearing long sleeves, sun glasses (UVA+UVB protection) and wide brim hats (4-inch brim around the entire circumference of the hat). Call for new or changing lesions.   Cryotherapy Aftercare  Wash gently with soap and water everyday.   Apply Vaseline and Band-Aid daily until healed.    Due to recent changes in healthcare laws, you may see results of your pathology and/or laboratory studies on MyChart before the doctors have had a chance to review them. We understand that in some cases there may be results that are confusing or concerning to you. Please understand that not all results are received at the same time and often the doctors may need to interpret multiple results in order to provide you with the best plan of care or course of treatment. Therefore, we ask that you please give [place] 2 business days to thoroughly review all your results before contacting the office for clarification. Should we see a critical lab result, you will be contacted sooner.   If You Need Anything After Your Visit  If you have any questions or concerns for your doctor, please call our main line at [phone] and press option 4 to reach your doctor's medical assistant. If no one answers, please leave a voicemail as directed and we will return your call as soon as possible. Messages left after 4 pm will be answered the following business day.   You may also send [place] a message via [place]. We typically respond to MyChart messages within 1-2 business days.  For prescription refills, please ask your pharmacy to  contact our office. Our fax number is [phone].  If you have an urgent issue when the clinic is closed that cannot wait until the next business day, you can page your doctor at the number below.    Please note that while we do our best to be available for urgent issues outside of office hours, we are not available 24/7.   If you have an urgent issue and are unable to reach [place], you may choose to seek medical care at your doctor's office, retail clinic, urgent care center, or emergency room.  If you have a medical emergency, please immediately call 911 or go to the emergency department.  Pager Numbers  - Dr. Nehemiah Massed: [phone]  - Dr. Laurence Ferrari: [phone]  - Dr. Nicole Kindred: [phone]  In the event of inclement weather, please call our main line at [phone] for an update on the status of any delays or closures.  Dermatology Medication Tips: Please keep the boxes that topical medications come in in order to help keep track of the instructions about where and how to use these. Pharmacies typically print the medication instructions only on the boxes and not directly on the medication tubes.   If your medication is too expensive, please contact our office at [phone] option 4 or send [place] a message through [place].   We are unable to tell what your  co-pay for medications will be in advance as this is different depending on your insurance coverage. However, we may be able to find a substitute medication at lower cost or fill out paperwork to get insurance to cover a needed medication.   If a prior authorization is required to get your medication covered by your insurance company, please allow [place] 1-2 business days to complete this process.  Drug prices often vary depending on where the prescription is filled and some pharmacies may offer cheaper prices.  The website www.goodrx.com contains coupons for medications through different pharmacies. The prices here do not account for what  the cost may be with help from insurance (it may be cheaper with your insurance), but the website can give you the price if you did not use any insurance.  - You can print the associated coupon and take it with your prescription to the pharmacy.  - You may also stop by our office during regular business hours and pick up a GoodRx coupon card.  - If you need your prescription sent electronically to a different pharmacy, notify our office through [facility] or by phone at [phone] option 4.     Si Usted Necesita Algo Despus de Su Visita  Tambin puede enviarnos un mensaje a travs de Pharmacist, community. Por lo general respondemos a los mensajes de MyChart en el transcurso de 1 a 2 das hbiles.  Para renovar recetas, por favor pida a su farmacia que se ponga en contacto con nuestra oficina. Harland Dingwall de fax es [place] [phone].  Si tiene un asunto urgente cuando la clnica est cerrada y que no puede esperar hasta el siguiente da hbil, puede llamar/localizar a su doctor(a) al nmero que aparece a continuacin.   Por favor, tenga en cuenta que aunque hacemos todo lo posible para estar disponibles para asuntos urgentes fuera del horario de [place], no estamos disponibles las 24 horas del da, los 7 das de la [place].   Si tiene un problema urgente y no puede comunicarse con nosotros, puede optar por buscar atencin mdica  en el consultorio de su doctor(a), en una clnica privada, en un centro de atencin urgente o en una sala de emergencias.  Si tiene Engineering geologist, por favor llame inmediatamente al 911 o vaya a la sala de emergencias.  Nmeros de bper  - Dr. Nehemiah Massed: [phone]  - Dra. Moye: [phone]  - Dra. Nicole Kindred: [phone]  En caso de inclemencias del [place], por favor llame a Johnsie Kindred principal al [phone] para una actualizacin sobre el [place] de cualquier retraso o cierre.  Consejos para la medicacin en dermatologa: Por favor, guarde las  cajas en las que vienen los medicamentos de uso tpico para ayudarle a seguir las instrucciones sobre dnde y cmo usarlos. Las farmacias generalmente imprimen las instrucciones del medicamento slo en las cajas y no directamente en los tubos del [place].   Si su medicamento es muy caro, por favor, pngase en contacto con Zigmund Daniel llamando al [phone] y presione la opcin 4 o envenos un mensaje a travs de Pharmacist, community.   No podemos decirle cul ser su copago por los medicamentos por adelantado ya que esto es diferente dependiendo de la cobertura de su seguro. Sin embargo, es posible que podamos encontrar un medicamento sustituto a Electrical engineer un formulario para que el seguro cubra el medicamento que se considera necesario.   Si se requiere una autorizacin previa para que su compaa de seguros [place] su medicamento, por  favor permtanos de 1 a 2 das hbiles para completar este proceso.  Los precios de los medicamentos varan con frecuencia dependiendo del Environmental consultant de dnde se surte la receta y alguna farmacias pueden ofrecer precios ms baratos.  El sitio web www.goodrx.com tiene cupones para medicamentos de Airline pilot. Los precios aqu no tienen en cuenta lo que podra costar con la ayuda del seguro (puede ser ms barato con su seguro), pero el sitio web puede darle el precio si no utiliz Research scientist (physical sciences).  - Puede imprimir el cupn correspondiente y llevarlo con su receta a la farmacia.  - Tambin puede pasar por nuestra oficina durante el horario de atencin regular y Charity fundraiser una tarjeta de cupones de GoodRx.  - Si necesita que su receta se enve electrnicamente a una farmacia diferente, informe a nuestra oficina a travs de MyChart de [place] o por telfono llamando al [phone] y presione la opcin 4.

## 2022-11-15 ENCOUNTER — Encounter: Payer: Self-pay | Admitting: Dermatology

## 2022-12-28 IMAGING — MR MR LUMBAR SPINE W/O CM
5 series · 31 of 48 positions shown · non-contrast
Comparison: None.

CLINICAL DATA: Chronic low back pain left worse than right. Pain
radiates to the left leg.

EXAM:
MRI LUMBAR SPINE WITHOUT CONTRAST
TECHNIQUE: Multiplanar, multisequence MR imaging of the lumbar spine was
performed. No intravenous contrast was administered.

[Series 16: T2 · sagittal · 4.0mm · 0.81mm/px · 6 of 17 slices shown (1 of 2)]
[im 1/17]
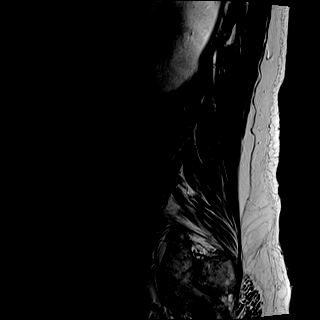
[im 4/17]
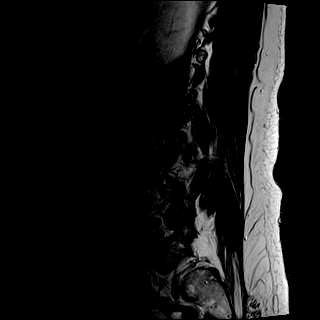
[im 7/17]
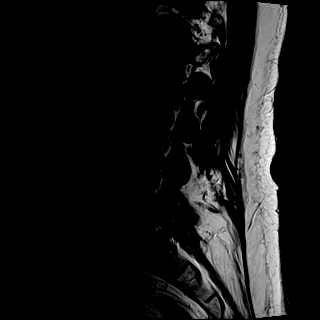
[im 10/17]
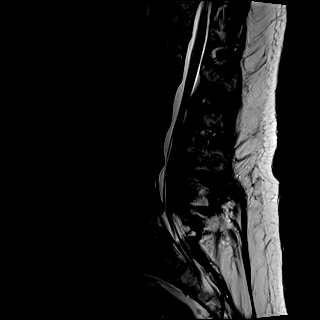
[im 13/17]
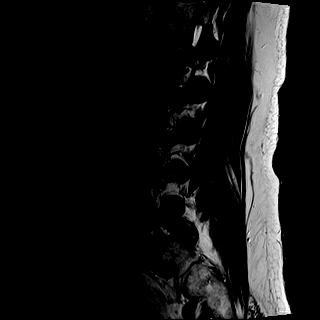
[im 17/17]
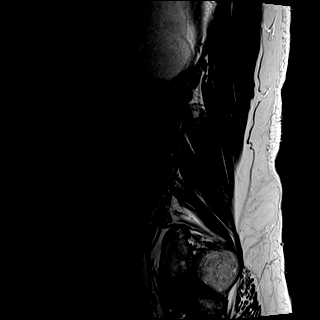

[Series 17: T1 · sagittal · 4.0mm · 0.81mm/px · 6 of 17 slices shown (1 of 2)]
[im 1/17]
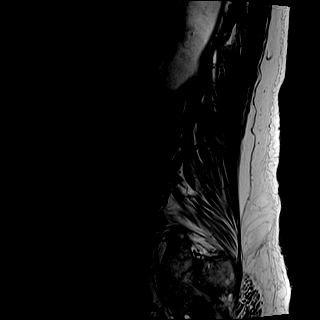
[im 4/17]
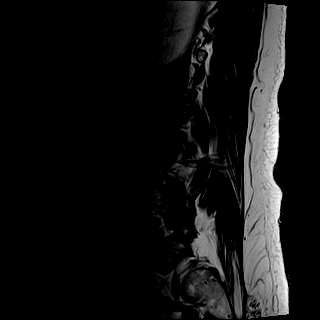
[im 7/17]
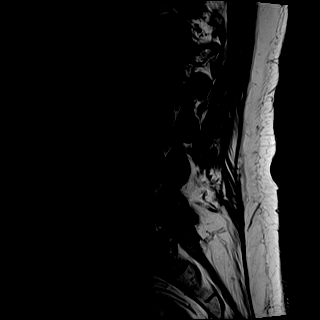
[im 10/17]
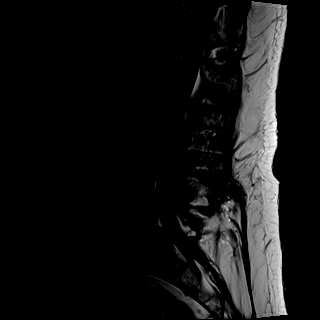
[im 13/17]
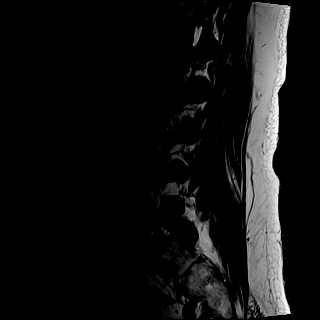
[im 17/17]
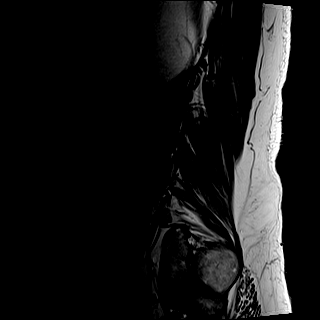

[Series 18: STIR · sagittal · 4.0mm · 0.41mm/px · 1 of 17 slices shown]
[im 1/17]
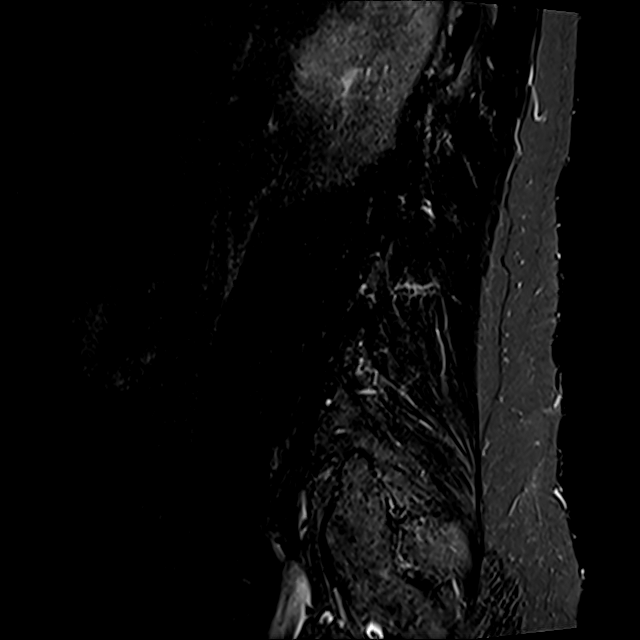

[Series 19: T2 · axial · 4.0mm · 0.78mm/px · z∈[-476,-248]mm · 9 of 39 slices shown (2 of 2)]
[im 1/39]
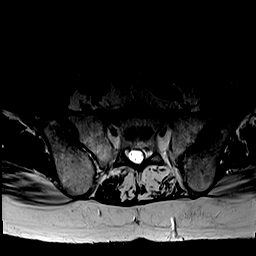
[im 6/39]
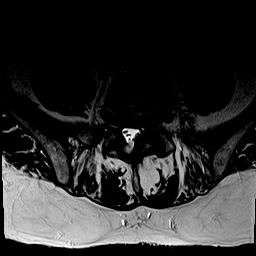
[im 11/39]
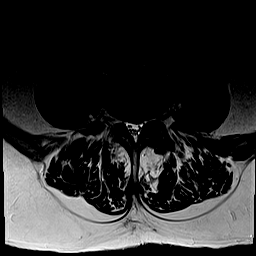
[im 17/39]
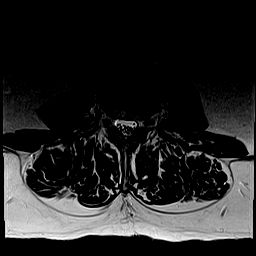
[im 20/39]
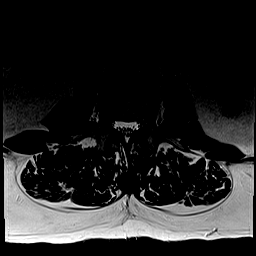
[im 22/39]
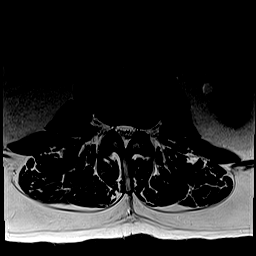
[im 28/39]
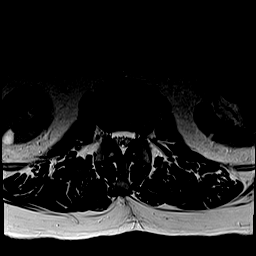
[im 33/39]
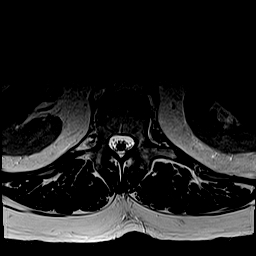
[im 39/39]
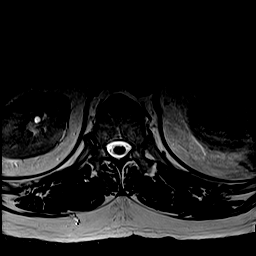

[Series 20: T1 · axial · 4.0mm · 0.39mm/px · z∈[-476,-248]mm · 9 of 39 slices shown (2 of 2)]
[im 1/39]
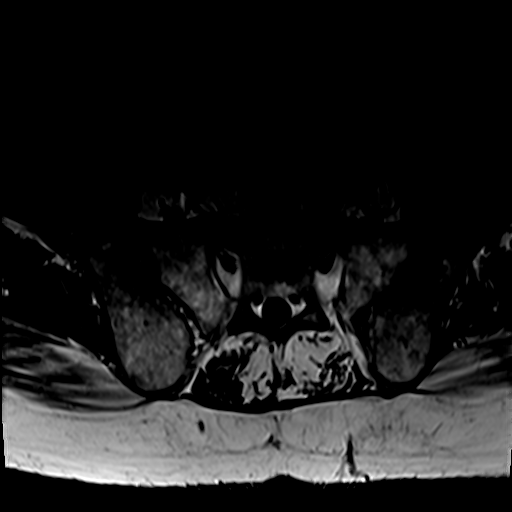
[im 6/39]
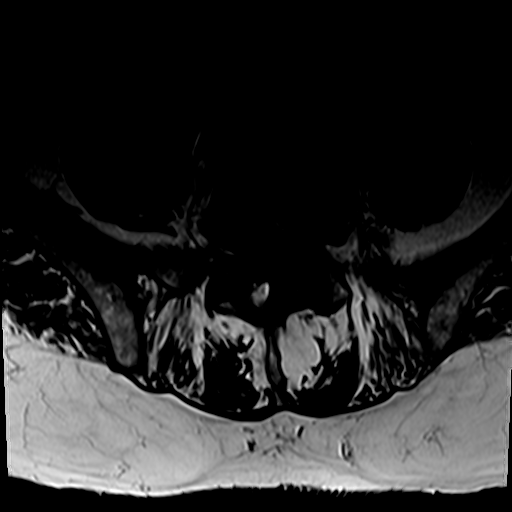
[im 11/39]
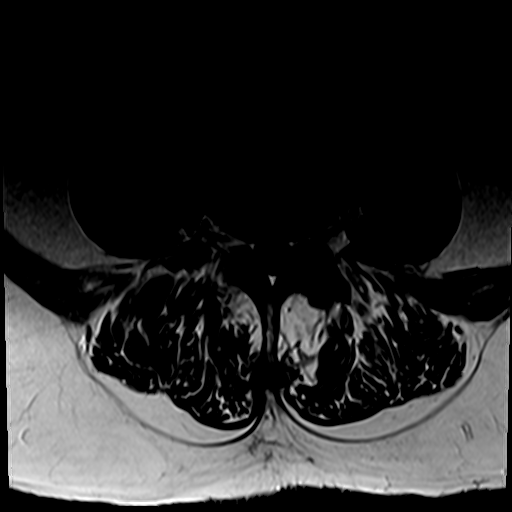
[im 17/39]
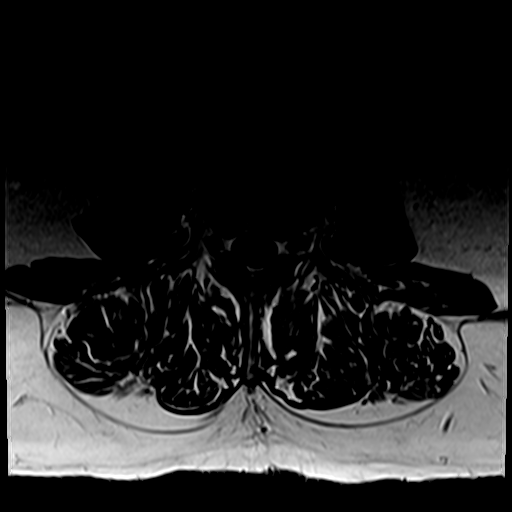
[im 20/39]
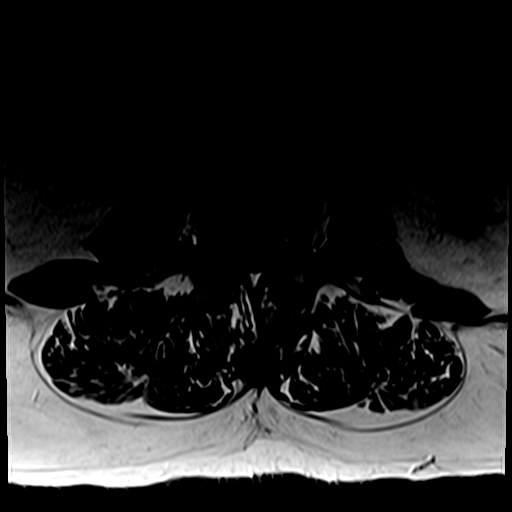
[im 22/39]
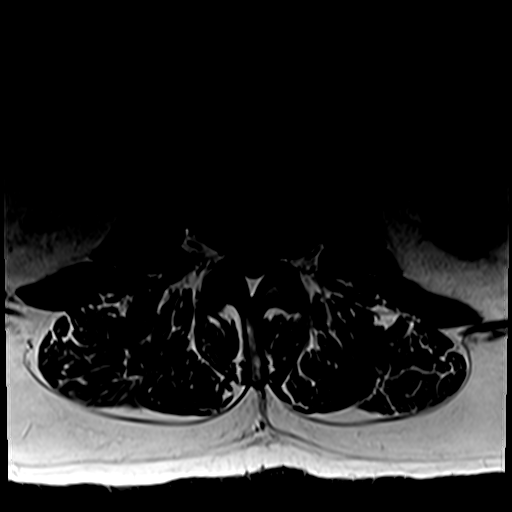
[im 28/39]
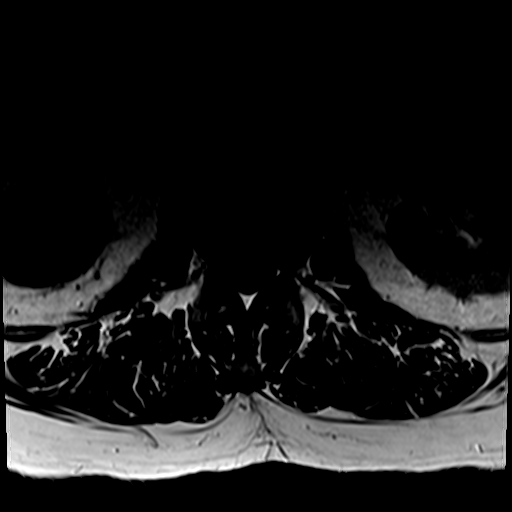
[im 33/39]
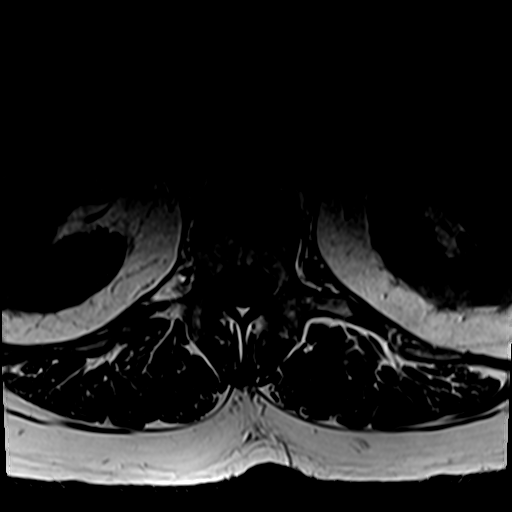
[im 39/39]
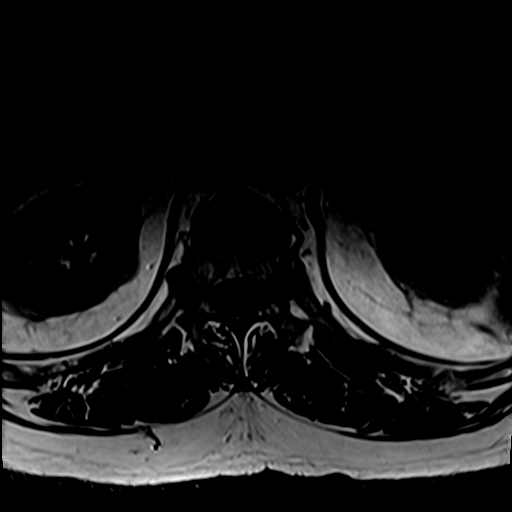

[31 of 48 positions shown; findings below may reference images not displayed]

FINDINGS: Segmentation:  5 lumbar type vertebral bodies.

Alignment: Spondylolisthesis at L5-S1 of 7 mm due to bilateral L5
pars defects.

Vertebrae:  No vertebral body fracture or focal lesion.

Conus medullaris and cauda equina: Conus extends to the L1-2 level.
Conus and cauda equina appear normal.

Paraspinal and other soft tissues: Negative

Disc levels:

L1-2: Mild bulging of the disc.  No compressive stenosis.

L2-3: Minimal bulging of the disc. Minimal facet hypertrophy. No
compressive stenosis.

L3-4: Mild bulging of the disc. Mild facet hypertrophy. Mild
narrowing of the lateral recesses without likely neural compression.

L4-5: Broad-based disc herniation more prominent in the left
posterolateral direction with slight caudal down turning of disc
material in the left lateral recess. Facet degeneration and
hypertrophy left more than right. Stenosis of both lateral recesses,
left worse than right. Neural compression is likely at this level,
particularly on the left.

L5-S1: Chronic bilateral pars defects with 7 mm of anterolisthesis.
Chronic disc degeneration with loss of disc height. Endplate
osteophytes and bulging of the disc. Mild stenosis of the
subarticular lateral recesses. Bilateral foraminal stenosis that
could affect either or both exiting L5 nerves.
IMPRESSION: L4-5: Broad-based disc herniation more prominent towards the left
with caudal migration of disc material in the left lateral recess.
Facet degeneration and hypertrophy left worse than right. Stenosis
of the lateral recesses, much worse on the left. Neural compression
likely at this level, particularly on the left.

L5-S1: Chronic bilateral pars defects with 7 mm of anterolisthesis.
Mild stenosis of both subarticular lateral recesses. Bilateral
neural foraminal stenosis could compress either or both L5 nerves.

L3-4: Disc bulge. Mild facet hypertrophy. Mild bilateral lateral
recess stenosis without definite neural compression.

## 2023-06-10 ENCOUNTER — Encounter: Payer: Self-pay | Admitting: Dermatology

## 2023-06-10 ENCOUNTER — Ambulatory Visit: Payer: Medicare Other | Admitting: Dermatology

## 2023-06-10 VITALS — BP 144/81 | HR 66

## 2023-06-10 DIAGNOSIS — Z86006 Personal history of melanoma in-situ: Secondary | ICD-10-CM

## 2023-06-10 DIAGNOSIS — L918 Other hypertrophic disorders of the skin: Secondary | ICD-10-CM

## 2023-06-10 DIAGNOSIS — L820 Inflamed seborrheic keratosis: Secondary | ICD-10-CM | POA: Diagnosis not present

## 2023-06-10 DIAGNOSIS — Z5111 Encounter for antineoplastic chemotherapy: Secondary | ICD-10-CM

## 2023-06-10 DIAGNOSIS — L578 Other skin changes due to chronic exposure to nonionizing radiation: Secondary | ICD-10-CM

## 2023-06-10 DIAGNOSIS — W908XXA Exposure to other nonionizing radiation, initial encounter: Secondary | ICD-10-CM

## 2023-06-10 DIAGNOSIS — D492 Neoplasm of unspecified behavior of bone, soft tissue, and skin: Secondary | ICD-10-CM | POA: Diagnosis not present

## 2023-06-10 DIAGNOSIS — Z1283 Encounter for screening for malignant neoplasm of skin: Secondary | ICD-10-CM | POA: Diagnosis not present

## 2023-06-10 DIAGNOSIS — L570 Actinic keratosis: Secondary | ICD-10-CM | POA: Diagnosis not present

## 2023-06-10 DIAGNOSIS — Z85828 Personal history of other malignant neoplasm of skin: Secondary | ICD-10-CM

## 2023-06-10 DIAGNOSIS — D229 Melanocytic nevi, unspecified: Secondary | ICD-10-CM

## 2023-06-10 DIAGNOSIS — Z85820 Personal history of malignant melanoma of skin: Secondary | ICD-10-CM

## 2023-06-10 DIAGNOSIS — D1723 Benign lipomatous neoplasm of skin and subcutaneous tissue of right leg: Secondary | ICD-10-CM

## 2023-06-10 DIAGNOSIS — Z86018 Personal history of other benign neoplasm: Secondary | ICD-10-CM

## 2023-06-10 DIAGNOSIS — L821 Other seborrheic keratosis: Secondary | ICD-10-CM

## 2023-06-10 DIAGNOSIS — D1801 Hemangioma of skin and subcutaneous tissue: Secondary | ICD-10-CM

## 2023-06-10 DIAGNOSIS — Z8589 Personal history of malignant neoplasm of other organs and systems: Secondary | ICD-10-CM

## 2023-06-10 DIAGNOSIS — L814 Other melanin hyperpigmentation: Secondary | ICD-10-CM

## 2023-06-10 DIAGNOSIS — Z872 Personal history of diseases of the skin and subcutaneous tissue: Secondary | ICD-10-CM

## 2023-06-10 DIAGNOSIS — Z7189 Other specified counseling: Secondary | ICD-10-CM

## 2023-06-10 DIAGNOSIS — Z79899 Other long term (current) drug therapy: Secondary | ICD-10-CM

## 2023-06-10 NOTE — Progress Notes (Deleted)
   Follow-Up Visit   Subjective  Lawrence Flores is a 67 y.o. male who presents for the following: 7 month AK follow up. Scalp. Tx with LN2 11/09/2022. Used 5FU/Calcipotriene after last visit.   The patient has spots, moles and lesions to be evaluated, some may be new or changing and the patient may have concern these could be cancer.  This patient is accompanied in the office by his spouse.   The following portions of the chart were reviewed this encounter and updated as appropriate: medications, allergies, medical history  Review of Systems:  No other skin or systemic complaints except as noted in HPI or Assessment and Plan.  Objective  Well appearing patient in no apparent distress; mood and affect are within normal limits.  A focused examination was performed of the following areas: Scalp, face, ears  Relevant exam findings are noted in the Assessment and Plan.    Assessment & Plan       No follow-ups on file.  I, Lawson Radar, CMA, am acting as scribe for Armida Sans, MD.   Documentation: I have reviewed the above documentation for accuracy and completeness, and I agree with the above.  Armida Sans, MD

## 2023-06-10 NOTE — Progress Notes (Signed)
Follow-Up Visit   Subjective  Lawrence Flores is a 67 y.o. male who presents for the following: Skin Cancer Screening and Full Body Skin Exam. HxBCC, HxMIS, HxSCC, HxDN, HxAKs  7 month AK follow up. Scalp. Tx with LN2 11/09/2022. Used 5FU/Calcipotriene after last visit.  Spots on right sideburn area. Spot at frontal scalp. Painful.   The patient presents for Total-Body Skin Exam (TBSE) for skin cancer screening and mole check. The patient has spots, moles and lesions to be evaluated, some may be new or changing and the patient may have concern these could be cancer.    The following portions of the chart were reviewed this encounter and updated as appropriate: medications, allergies, medical history  Review of Systems:  No other skin or systemic complaints except as noted in HPI or Assessment and Plan.  Objective  Well appearing patient in no apparent distress; mood and affect are within normal limits.  A full examination was performed including scalp, head, eyes, ears, nose, lips, neck, chest, axillae, abdomen, back, buttocks, bilateral upper extremities, bilateral lower extremities, hands, feet, fingers, toes, fingernails, and toenails. All findings within normal limits unless otherwise noted below.   Relevant physical exam findings are noted in the Assessment and Plan.  Left Mid Back x1, right thigh x7 (8) Erythematous keratotic or waxy stuck-on papule or plaque.  Right Thigh lateral 0.6 cm pink fleshy papule       Scalp x11, preauricular x1 (12) Erythematous thin papules/macules with gritty scale.     Assessment & Plan   HISTORY OF MELANOMA IN SITU. Right posterior arm. 09/09/2010. - No evidence of recurrence today - No lymphadenopathy - Recommend regular full body skin exams - Recommend daily broad spectrum sunscreen SPF 30+ to sun-exposed areas, reapply every 2 hours as needed.  - Call if any new or changing lesions are noted between office visits   HISTORY OF  SQUAMOUS CELL CARCINOMA OF THE SKIN. Upper posterior scalp. Mohs. 02/10/2017 - No evidence of recurrence today - No lymphadenopathy - Recommend regular full body skin exams - Recommend daily broad spectrum sunscreen SPF 30+ to sun-exposed areas, reapply every 2 hours as needed.  - Call if any new or changing lesions are noted between office visits  HISTORY OF BASAL CELL CARCINOMA OF THE SKIN. Right dorsum hand. Mohs 09/07/2022. - No evidence of recurrence today - Recommend regular full body skin exams - Recommend daily broad spectrum sunscreen SPF 30+ to sun-exposed areas, reapply every 2 hours as needed.  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 05/04/2022. No evidence of recurrence today Recommend regular full body skin exams Recommend daily broad spectrum sunscreen SPF 30+ to sun-exposed areas, reapply every 2 hours as needed.  Call if any new or changing lesions are noted between office visits   SKIN CANCER SCREENING PERFORMED TODAY.  ACTINIC DAMAGE WITH PRECANCEROUS ACTINIC KERATOSES Counseling for Topical Chemotherapy Management: Patient exhibits: - Severe, confluent actinic changes with pre-cancerous actinic keratoses that is secondary to cumulative UV radiation exposure over time - Condition that is severe; chronic, not at goal. - diffuse scaly erythematous macules and papules with underlying dyspigmentation - Discussed Prescription "Field Treatment" topical Chemotherapy for Severe, Chronic Confluent Actinic Changes with Pre-Cancerous Actinic Keratoses Field treatment involves treatment of an entire area of skin that has confluent Actinic Changes (Sun/ Ultraviolet light damage) and PreCancerous Actinic Keratoses by method of PhotoDynamic Therapy (  PDT) and/or prescription Topical Chemotherapy agents such as 5-fluorouracil, 5-fluorouracil/calcipotriene, and/or imiquimod.  The purpose is to  decrease the number of clinically evident and subclinical PreCancerous lesions to prevent progression to development of skin cancer by chemically destroying early precancer changes that may or may not be visible.  It has been shown to reduce the risk of developing skin cancer in the treated area. As a result of treatment, redness, scaling, crusting, and open sores may occur during treatment course. One or more than one of these methods may be used and may have to be used several times to control, suppress and eliminate the PreCancerous changes. Discussed treatment course, expected reaction, and possible side effects. - Recommend daily broad spectrum sunscreen SPF 30+ to sun-exposed areas, reapply every 2 hours as needed.  - Staying in the shade or wearing long sleeves, sun glasses (UVA+UVB protection) and wide brim hats (4-inch brim around the entire circumference of the hat) are also recommended. - Call for new or changing lesions.  Apply 5-fluorouracil/calcipotriene cream twice a day for 10 days to affected areas including scalp. Prescription sent to [facility]. Patient advised they will receive an email to purchase the medication online and have it sent to their home. Patient provided with handout reviewing treatment course and side effects and advised to call or message [place] on MyChart with any concerns.  Begin in 1 month  LENTIGINES, SEBORRHEIC KERATOSES, HEMANGIOMAS - Benign normal skin lesions - Benign-appearing - Call for any changes  MELANOCYTIC NEVI - Tan-brown and/or pink-flesh-colored symmetric macules and papules - Benign appearing on exam today - Observation - Call clinic for new or changing moles - Recommend daily use of broad spectrum spf 30+ sunscreen to sun-exposed areas.    Acrochordons (Skin Tags) - Fleshy, skin-colored pedunculated papules - Benign appearing.  - Observe. - If desired, they can be removed with an in office procedure that is not  covered by insurance. - Please call the clinic if you notice any new or changing lesions.    Inflamed seborrheic keratosis (8) Left Mid Back x1, right thigh x7  Symptomatic, irritating, patient would like treated.  Destruction of lesion - Left Mid Back x1, right thigh x7 (8) Complexity: simple   Destruction method: cryotherapy   Informed consent: discussed and consent obtained   Timeout:  patient name, date of birth, surgical site, and procedure verified Lesion destroyed using liquid nitrogen: Yes   Region frozen until ice ball extended beyond lesion: Yes   Outcome: patient tolerated procedure well with no complications   Post-procedure details: wound care instructions given   Additional details:  Prior to procedure, discussed risks of blister formation, small wound, skin dyspigmentation, or rare scar following cryotherapy. Recommend Vaseline ointment to treated areas while healing.   Neoplasm of skin Right Thigh lateral  Epidermal / dermal shaving  Lesion diameter (cm):  0.6 Informed consent: discussed and consent obtained   Timeout: patient name, date of birth, surgical site, and procedure verified   Procedure prep:  Patient was prepped and draped in usual sterile fashion Prep type:  Isopropyl alcohol Anesthesia: the lesion was anesthetized in a standard fashion   Anesthetic:  1% lidocaine w/ epinephrine 1-100,000 buffered w/ 8.4% NaHCO3 Instrument used: flexible razor blade   Hemostasis achieved with: pressure, aluminum chloride and electrodesiccation   Outcome: patient tolerated procedure well   Post-procedure details: sterile dressing applied and wound care instructions given   Dressing type: bandage and petrolatum    Specimen 1 -  Surgical pathology Differential Diagnosis: Nevus, R/O dysplasia  Check Margins: No  AK (actinic keratosis) (12) Scalp x11, preauricular x1  Actinic keratoses are precancerous spots that appear secondary to cumulative UV radiation  exposure/sun exposure over time. They are chronic with expected duration over 1 year. A portion of actinic keratoses will progress to squamous cell carcinoma of the skin. It is not possible to reliably predict which spots will progress to skin cancer and so treatment is recommended to prevent development of skin cancer.  Recommend daily broad spectrum sunscreen SPF 30+ to sun-exposed areas, reapply every 2 hours as needed.  Recommend staying in the shade or wearing long sleeves, sun glasses (UVA+UVB protection) and wide brim hats (4-inch brim around the entire circumference of the hat). Call for new or changing lesions.  Destruction of lesion - Scalp x11, preauricular x1 (12) Complexity: simple   Destruction method: cryotherapy   Informed consent: discussed and consent obtained   Timeout:  patient name, date of birth, surgical site, and procedure verified Lesion destroyed using liquid nitrogen: Yes   Region frozen until ice ball extended beyond lesion: Yes   Outcome: patient tolerated procedure well with no complications   Post-procedure details: wound care instructions given   Additional details:  Prior to procedure, discussed risks of blister formation, small wound, skin dyspigmentation, or rare scar following cryotherapy. Recommend Vaseline ointment to treated areas while healing.    Return for AK Follow Up, UBSE, in 6-8 months; HxMIS, HxBCC, HxSCC, HxDN,.  I, Lawson Radar, CMA, am acting as scribe for Armida Sans, MD.   Documentation: I have reviewed the above documentation for accuracy and completeness, and I agree with the above.  Armida Sans, MD

## 2023-06-10 NOTE — Patient Instructions (Addendum)
Cryotherapy Aftercare  Wash gently with soap and water everyday.   Apply Vaseline Jelly daily until healed.    Begin in 1 month Apply 5-fluorouracil/calcipotriene cream twice a day for 10 days to affected areas including scalp. Prescription sent to [facility]. Patient advised they will receive an email to purchase the medication online and have it sent to their home. Patient provided with handout reviewing treatment course and side effects and advised to call or message [place] on MyChart with any concerns.   Instructions for [facility]  One or more of your medications was sent to the [facility]. You will receive an email from them and can purchase the medicine through that link. It will then be mailed to your home at the address you confirmed. If for any reason you do not receive an email from them, please check your spam folder. If you still do not find the email, please let us know. [facility] phone number is [phone].    Wound Care Instructions  Cleanse wound gently with soap and water once a day then pat dry with clean gauze. Apply a thin coat of Petrolatum (petroleum jelly, "Vaseline") over the wound (unless you have an allergy to this). We recommend that you use a new, sterile tube of Vaseline. Do not pick or remove scabs. Do not remove the yellow or white "healing tissue" from the base of the wound.  Cover the wound with fresh, clean, nonstick gauze and secure with paper tape. You may use Band-Aids in place of gauze and tape if the wound is small enough, but would recommend trimming much of the tape off as there is often too much. Sometimes Band-Aids can irritate the skin.  You should call the office for your biopsy report after 1 week if you have not already been contacted.  If you experience any problems, such as abnormal amounts of bleeding, swelling, significant bruising, significant pain, or  evidence of infection, please call the office immediately.  FOR ADULT SURGERY PATIENTS: If you need something for pain relief you may take 1 extra strength Tylenol (acetaminophen) AND 2 Ibuprofen (200mg  each) together every 4 hours as needed for pain. (do not take these if you are allergic to them or if you have a reason you should not take them.) Typically, you may only need pain medication for 1 to 3 days.     Recommend daily broad spectrum sunscreen SPF 30+ to sun-exposed areas, reapply every 2 hours as needed. Call for new or changing lesions.  Staying in the shade or wearing long sleeves, sun glasses (UVA+UVB protection) and wide brim hats (4-inch brim around the entire circumference of the hat) are also recommended for sun protection.    Melanoma ABCDEs  Melanoma is the most dangerous type of skin cancer, and is the leading cause of death from skin disease.  You are more likely to develop melanoma if you: Have light-colored skin, light-colored eyes, or red or blond hair Spend a lot of time in the sun Tan regularly, either outdoors or in a tanning bed Have had blistering sunburns, especially during childhood Have a close family member who has had a melanoma Have atypical moles or large birthmarks  Early detection of melanoma is key since treatment is typically straightforward and cure rates are extremely high if we catch it early.   The first sign of melanoma is often a change in a mole or a new dark spot.  The ABCDE  system is a way of remembering the signs of melanoma.  A for asymmetry:  The two halves do not match. B for border:  The edges of the growth are irregular. C for color:  A mixture of colors are present instead of an even brown color. D for diameter:  Melanomas are usually (but not always) greater than 6mm - the size of a pencil eraser. E for evolution:  The spot keeps changing in size, shape, and color.  Please check your skin once per month between visits. You can use  a small mirror in front and a large mirror behind you to keep an eye on the back side or your body.   If you see any new or changing lesions before your next follow-up, please call to schedule a visit.  Please continue daily skin protection including broad spectrum sunscreen SPF 30+ to sun-exposed areas, reapplying every 2 hours as needed when you're outdoors.   Staying in the shade or wearing long sleeves, sun glasses (UVA+UVB protection) and wide brim hats (4-inch brim around the entire circumference of the hat) are also recommended for sun protection.     Due to recent changes in healthcare laws, you may see results of your pathology and/or laboratory studies on MyChart before the doctors have had a chance to review them. We understand that in some cases there may be results that are confusing or concerning to you. Please understand that not all results are received at the same time and often the doctors may need to interpret multiple results in order to provide you with the best plan of care or course of treatment. Therefore, we ask that you please give [place] 2 business days to thoroughly review all your results before contacting the office for clarification. Should we see a critical lab result, you will be contacted sooner.   If You Need Anything After Your Visit  If you have any questions or concerns for your doctor, please call our main line at [phone] and press option 4 to reach your doctor's medical assistant. If no one answers, please leave a voicemail as directed and we will return your call as soon as possible. Messages left after 4 pm will be answered the following business day.   You may also send [place] a message via MyChart. We typically respond to MyChart messages within 1-2 business days.  For prescription refills, please ask your pharmacy to contact our office. Our fax number is [phone].  If you have an urgent issue when the clinic is closed that cannot wait until the next  business day, you can page your doctor at the number below.    Please note that while we do our best to be available for urgent issues outside of office hours, we are not available 24/7.   If you have an urgent issue and are unable to reach [place], you may choose to seek medical care at your doctor's office, retail clinic, urgent care center, or emergency room.  If you have a medical emergency, please immediately call 911 or go to the emergency department.  Pager Numbers  - Dr. Gwen Pounds: [phone]  - Dr. Roseanne Reno: [phone]  - Dr. Katrinka Blazing: [phone]   In the event of inclement weather, please call our main line at [phone] for an update on the status of any delays or closures.  Dermatology Medication Tips: Please keep the boxes that topical medications come in in order to help keep track of the instructions about where and how to  use these. Pharmacies typically print the medication instructions only on the boxes and not directly on the medication tubes.   If your medication is too expensive, please contact our office at [phone] option 4 or send [place] a message through MyChart.   We are unable to tell what your co-pay for medications will be in advance as this is different depending on your insurance coverage. However, we may be able to find a substitute medication at lower cost or fill out paperwork to get insurance to cover a needed medication.   If a prior authorization is required to get your medication covered by your insurance company, please allow [place] 1-2 business days to complete this process.  Drug prices often vary depending on where the prescription is filled and some pharmacies may offer cheaper prices.  The website www.goodrx.com contains coupons for medications through different pharmacies. The prices here do not account for what the cost may be with help from insurance (it may be cheaper with your insurance), but the website can give you the price if you did not use  any insurance.  - You can print the associated coupon and take it with your prescription to the pharmacy.  - You may also stop by our office during regular business hours and pick up a GoodRx coupon card.  - If you need your prescription sent electronically to a different pharmacy, notify our office through [facility] or by phone at [phone] option 4.     Si Usted Necesita Algo Despus de Su Visita  Tambin puede enviarnos un mensaje a travs de Clinical cytogeneticist. Por lo general respondemos a los mensajes de MyChart en el transcurso de 1 a 2 das hbiles.  Para renovar recetas, por favor pida a su farmacia que se ponga en contacto con nuestra oficina. Annie Sable de fax es [place] [phone].  Si tiene un asunto urgente cuando la clnica est cerrada y que no puede esperar hasta el siguiente da hbil, puede llamar/localizar a su doctor(a) al nmero que aparece a continuacin.   Por favor, tenga en cuenta que aunque hacemos todo lo posible para estar disponibles para asuntos urgentes fuera del horario de [place], no estamos disponibles las 24 horas del da, los 7 [address]  [address] de la [place].   Si tiene un problema urgente y no puede comunicarse con nosotros, puede optar por buscar atencin mdica  en el consultorio de su doctor(a), en una clnica privada, en un centro de atencin urgente o en una sala de emergencias.  Si tiene Engineer, drilling, por favor llame inmediatamente al 911 o vaya a la sala de emergencias.  Nmeros de bper  - Dr. Gwen Pounds: [phone]  - Dra. Roseanne Reno: [phone]  - Dr. Katrinka Blazing: [phone]   En caso de inclemencias del tiempo, por favor llame a Lacy Duverney principal al [phone] para una actualizacin sobre el [place] de cualquier retraso o cierre.  Consejos para la medicacin en dermatologa: Por favor, guarde las cajas en las que vienen los medicamentos de uso tpico para ayudarle a seguir las instrucciones sobre dnde y cmo usarlos. Las farmacias  generalmente imprimen las instrucciones del medicamento slo en las cajas y no directamente en los tubos del [place].   Si su medicamento es muy caro, por favor, pngase en contacto con Rolm Gala llamando al [phone] y presione la opcin 4 o envenos un mensaje a travs de Clinical cytogeneticist.   No podemos decirle cul ser su copago por los medicamentos por adelantado ya que esto es diferente dependiendo  de la cobertura de su seguro. Sin embargo, es posible que podamos encontrar un medicamento sustituto a Audiological scientist un formulario para que el seguro cubra el medicamento que se considera necesario.   Si se requiere una autorizacin previa para que su compaa de seguros [place] su medicamento, por favor permtanos de 1 a 2 das hbiles para completar [address]  Los precios de los medicamentos varan con frecuencia dependiendo del Environmental consultant de dnde se surte la receta y alguna farmacias pueden ofrecer precios ms baratos.  El sitio web www.goodrx.com tiene cupones para medicamentos de Health and safety inspector. Los precios aqu no tienen en cuenta lo que podra costar con la ayuda del seguro (puede ser ms barato con su seguro), pero el sitio web puede darle el precio si no utiliz Tourist information centre manager.  - Puede imprimir el cupn correspondiente y llevarlo con su receta a la farmacia.  - Tambin puede pasar por nuestra oficina durante el horario de atencin regular y Education officer, museum una tarjeta de cupones de GoodRx.  - Si necesita que su receta se enve electrnicamente a una farmacia diferente, informe a nuestra oficina a travs de MyChart de [place] o por telfono llamando al [phone] y presione la opcin 4.

## 2023-06-14 LAB — SURGICAL PATHOLOGY

## 2023-06-15 ENCOUNTER — Telehealth: Payer: Self-pay

## 2023-06-15 NOTE — Telephone Encounter (Signed)
-----   Message from Armida Sans sent at 06/15/2023 10:41 AM EDT ----- FINAL DIAGNOSIS        1. Skin, right thigh lateral :       NEVUS LIPOMATOSUS SUPERFICIALIS   Benign "fatty mole" No further treatment needed

## 2023-06-15 NOTE — Telephone Encounter (Signed)
Patient advised of BX results.

## 2023-12-15 ENCOUNTER — Ambulatory Visit: Payer: Medicare Other | Admitting: Dermatology

## 2024-01-19 ENCOUNTER — Ambulatory Visit: Admitting: Dermatology

## 2024-01-19 DIAGNOSIS — D489 Neoplasm of uncertain behavior, unspecified: Secondary | ICD-10-CM

## 2024-01-19 DIAGNOSIS — W908XXA Exposure to other nonionizing radiation, initial encounter: Secondary | ICD-10-CM | POA: Diagnosis not present

## 2024-01-19 DIAGNOSIS — Z86018 Personal history of other benign neoplasm: Secondary | ICD-10-CM

## 2024-01-19 DIAGNOSIS — Z8589 Personal history of malignant neoplasm of other organs and systems: Secondary | ICD-10-CM

## 2024-01-19 DIAGNOSIS — D229 Melanocytic nevi, unspecified: Secondary | ICD-10-CM

## 2024-01-19 DIAGNOSIS — D492 Neoplasm of unspecified behavior of bone, soft tissue, and skin: Secondary | ICD-10-CM | POA: Diagnosis not present

## 2024-01-19 DIAGNOSIS — L570 Actinic keratosis: Secondary | ICD-10-CM | POA: Diagnosis not present

## 2024-01-19 DIAGNOSIS — Z85828 Personal history of other malignant neoplasm of skin: Secondary | ICD-10-CM

## 2024-01-19 DIAGNOSIS — D2261 Melanocytic nevi of right upper limb, including shoulder: Secondary | ICD-10-CM

## 2024-01-19 DIAGNOSIS — L578 Other skin changes due to chronic exposure to nonionizing radiation: Secondary | ICD-10-CM | POA: Diagnosis not present

## 2024-01-19 DIAGNOSIS — L821 Other seborrheic keratosis: Secondary | ICD-10-CM

## 2024-01-19 DIAGNOSIS — L918 Other hypertrophic disorders of the skin: Secondary | ICD-10-CM

## 2024-01-19 DIAGNOSIS — L814 Other melanin hyperpigmentation: Secondary | ICD-10-CM

## 2024-01-19 DIAGNOSIS — L820 Inflamed seborrheic keratosis: Secondary | ICD-10-CM | POA: Diagnosis not present

## 2024-01-19 DIAGNOSIS — D692 Other nonthrombocytopenic purpura: Secondary | ICD-10-CM

## 2024-01-19 DIAGNOSIS — Z1283 Encounter for screening for malignant neoplasm of skin: Secondary | ICD-10-CM

## 2024-01-19 DIAGNOSIS — D1801 Hemangioma of skin and subcutaneous tissue: Secondary | ICD-10-CM

## 2024-01-19 DIAGNOSIS — Z85820 Personal history of malignant melanoma of skin: Secondary | ICD-10-CM

## 2024-01-19 NOTE — Progress Notes (Signed)
 Follow-Up Visit   Subjective  Lawrence Flores is a 68 y.o. male who presents for the following: Skin Cancer Screening and Upper Body Skin Exam Hx of aks, hx of isks, hx of bcc, hx of scc, hx of melanoma in situ, hx of dysplastic nevi  Patient reports he used 5 f/u field treatment cream to scalp for 9 days and got very inflamed and red.   The patient presents for Upper Body Skin Exam (UBSE) for skin cancer screening and mole check. The patient has spots, moles and lesions to be evaluated, some may be new or changing and the patient may have concern these could be cancer.  The following portions of the chart were reviewed this encounter and updated as appropriate: medications, allergies, medical history  Review of Systems:  No other skin or systemic complaints except as noted in HPI or Assessment and Plan.  Objective  Well appearing patient in no apparent distress; mood and affect are within normal limits.  All skin waist up examined. Relevant physical exam findings are noted in the Assessment and Plan.  scalp and face x 5 (5) Erythematous thin papules/macules with gritty scale.  face and scalp x 16 (16) Erythematous stuck-on, waxy papule or plaque right posterior shoulder / deltoid 0.4 cm dark brown macule    Assessment & Plan   ACTINIC KERATOSIS (5) scalp and face x 5 (5) Actinic keratoses are precancerous spots that appear secondary to cumulative UV radiation exposure/sun exposure over time. They are chronic with expected duration over 1 year. A portion of actinic keratoses will progress to squamous cell carcinoma of the skin. It is not possible to reliably predict which spots will progress to skin cancer and so treatment is recommended to prevent development of skin cancer.  Recommend daily broad spectrum sunscreen SPF 30+ to sun-exposed areas, reapply every 2 hours as needed.  Recommend staying in the shade or wearing long sleeves, sun glasses (UVA+UVB protection) and wide  brim hats (4-inch brim around the entire circumference of the hat). Call for new or changing lesions. Destruction of lesion - scalp and face x 5 (5) Complexity: simple   Destruction method: cryotherapy   Informed consent: discussed and consent obtained   Timeout:  patient name, date of birth, surgical site, and procedure verified Lesion destroyed using liquid nitrogen: Yes   Region frozen until ice ball extended beyond lesion: Yes   Outcome: patient tolerated procedure well with no complications   Post-procedure details: wound care instructions given   INFLAMED SEBORRHEIC KERATOSIS (16) face and scalp x 16 (16) Symptomatic, irritating, patient would like treated. Destruction of lesion - face and scalp x 16 (16) Complexity: simple   Destruction method: cryotherapy   Informed consent: discussed and consent obtained   Timeout:  patient name, date of birth, surgical site, and procedure verified Lesion destroyed using liquid nitrogen: Yes   Region frozen until ice ball extended beyond lesion: Yes   Outcome: patient tolerated procedure well with no complications   Post-procedure details: wound care instructions given   NEOPLASM OF UNCERTAIN BEHAVIOR right posterior shoulder / deltoid Epidermal / dermal shaving  Lesion diameter (cm):  0.4 Informed consent: discussed and consent obtained   Timeout: patient name, date of birth, surgical site, and procedure verified   Procedure prep:  Patient was prepped and draped in usual sterile fashion Prep type:  Isopropyl alcohol Anesthesia: the lesion was anesthetized in a standard fashion   Anesthetic:  1% lidocaine  w/ epinephrine  1-100,000 buffered w/  8.4% NaHCO3 Instrument used: flexible razor blade   Hemostasis achieved with: pressure, aluminum chloride and electrodesiccation   Outcome: patient tolerated procedure well   Post-procedure details: sterile dressing applied and wound care instructions given   Dressing type: bandage and petrolatum    Specimen 1 - Surgical pathology Differential Diagnosis: nevus r/o dysplasia   Check Margins: yes Nevus r/o dysplasia  ACTINIC SKIN DAMAGE   SKIN CANCER SCREENING   LENTIGO   MELANOCYTIC NEVUS, UNSPECIFIED LOCATION   HISTORY OF BASAL CELL CARCINOMA   HISTORY OF SQUAMOUS CELL CARCINOMA   HISTORY OF MALIGNANT MELANOMA   HISTORY OF DYSPLASTIC NEVUS   PURPURA (HCC)   Skin cancer screening performed today.  Actinic Damage - Chronic condition, secondary to cumulative UV/sun exposure - diffuse scaly erythematous macules with underlying dyspigmentation - Recommend daily broad spectrum sunscreen SPF 30+ to sun-exposed areas, reapply every 2 hours as needed.  - Staying in the shade or wearing long sleeves, sun glasses (UVA+UVB protection) and wide brim hats (4-inch brim around the entire circumference of the hat) are also recommended for sun protection.  - Call for new or changing lesions.  Lentigines, Seborrheic Keratoses, Hemangiomas - Benign normal skin lesions - Benign-appearing - Call for any changes  Melanocytic Nevi - Tan-brown and/or pink-flesh-colored symmetric macules and papules - Benign appearing on exam today - Observation - Call clinic for new or changing moles - Recommend daily use of broad spectrum spf 30+ sunscreen to sun-exposed areas.   Acrochordons (Skin Tags) - Fleshy, skin-colored pedunculated papules - Benign appearing.  - Observe. - If desired, they can be removed with an in office procedure that is not covered by insurance. - Please call the clinic if you notice any new or changing lesions.  Purpura - Chronic; persistent and recurrent.  Treatable, but not curable. - Violaceous macules and patches - Benign - Related to trauma, age, sun damage and/or use of blood thinners, chronic use of topical and/or oral steroids - Observe - Can use OTC arnica containing moisturizer such as Dermend Bruise Formula if desired - Call for worsening or  other concerns  Lipoma  Exam: 1.2 cm Subcutaneous rubbery nodule Location: left lower back above waist line  Lipoma Benign-appearing. Exam most consistent with an Lipoma. Discussed that a Lipoma is a benign fatty growth that can grow over time and sometimes become painful or otherwise symptomatic. Some patients may have one or several lipomas.. Benign Hereditary Lipomatosis is a hereditary familial condition where family members tend to grow multiple lipomas.  Recommend observation if it is not changing, growing or symptomatic. Recommend surgical excision to remove it if it is painful, growing, symptomatic, or other changes noted. Please contact our office for new or changing lesions so they can be evaluated.  HISTORY OF MELANOMA IN SITU. Right posterior arm. 09/09/2010. - No evidence of recurrence today - No lymphadenopathy - Recommend regular full body skin exams - Recommend daily broad spectrum sunscreen SPF 30+ to sun-exposed areas, reapply every 2 hours as needed.  - Call if any new or changing lesions are noted between office visits    HISTORY OF SQUAMOUS CELL CARCINOMA OF THE SKIN. Upper posterior scalp. Mohs. 02/10/2017 - No evidence of recurrence today - No lymphadenopathy - Recommend regular full body skin exams - Recommend daily broad spectrum sunscreen SPF 30+ to sun-exposed areas, reapply every 2 hours as needed.  - Call if any new or changing lesions are noted between office visits   HISTORY OF BASAL CELL CARCINOMA  OF THE SKIN. Right dorsum hand. Mohs 09/07/2022. - No evidence of recurrence today - Recommend regular full body skin exams - Recommend daily broad spectrum sunscreen SPF 30+ to sun-exposed areas, reapply every 2 hours as needed.  - Call if any new or changing lesions are noted between office visits   HISTORY OF DYSPLASTIC NEVUS. Mid to upper back, moderate atypia. 05/04/2022. No evidence of recurrence today Recommend regular full body skin exams Recommend daily  broad spectrum sunscreen SPF 30+ to sun-exposed areas, reapply every 2 hours as needed.  Call if any new or changing lesions are noted between office visits   Return for 1 year tbse hx of mmis .  IRandee Busing, CMA, am acting as scribe for Celine Collard, MD.   Documentation: I have reviewed the above documentation for accuracy and completeness, and I agree with the above.  Celine Collard, MD

## 2024-01-19 NOTE — Patient Instructions (Addendum)
 Biopsy Wound Care Instructions  Leave the original bandage on for 24 hours if possible.  If the bandage becomes soaked or soiled before that time, it is OK to remove it and examine the wound.  A small amount of post-operative bleeding is normal.  If excessive bleeding occurs, remove the bandage, place gauze over the site and apply continuous pressure (no peeking) over the area for 30 minutes. If this does not work, please call our clinic as soon as possible or page your doctor if it is after hours.   Once a day, cleanse the wound with soap and water. It is fine to shower. If a thick crust develops you may use a Q-tip dipped into dilute hydrogen peroxide (mix 1:1 with water) to dissolve it.  Hydrogen peroxide can slow the healing process, so use it only as needed.    After washing, apply petroleum jelly (Vaseline) or an antibiotic ointment if your doctor prescribed one for you, followed by a bandage.    For best healing, the wound should be covered with a layer of ointment at all times. If you are not able to keep the area covered with a bandage to hold the ointment in place, this may mean re-applying the ointment several times a day.  Continue this wound care until the wound has healed and is no longer open.   Itching and mild discomfort is normal during the healing process. However, if you develop pain or severe itching, please call our office.   If you have any discomfort, you can take Tylenol (acetaminophen) or ibuprofen as directed on the bottle. (Please do not take these if you have an allergy to them or cannot take them for another reason).  Some redness, tenderness and white or yellow material in the wound is normal healing.  If the area becomes very sore and red, or develops a thick yellow-green material (pus), it may be infected; please notify us.    If you have stitches, return to clinic as directed to have the stitches removed. You will continue wound care for 2-3 days after the stitches  are removed.   Wound healing continues for up to one year following surgery. It is not unusual to experience pain in the scar from time to time during the interval.  If the pain becomes severe or the scar thickens, you should notify the office.    A slight amount of redness in a scar is expected for the first six months.  After six months, the redness will fade and the scar will soften and fade.  The color difference becomes less noticeable with time.  If there are any problems, return for a post-op surgery check at your earliest convenience.  To improve the appearance of the scar, you can use silicone scar gel, cream, or sheets (such as Mederma or Serica) every night for up to one year. These are available over the counter (without a prescription).  Please call our office at [phone] for any questions or concerns.      Actinic keratoses are precancerous spots that appear secondary to cumulative UV radiation exposure/sun exposure over time. They are chronic with expected duration over 1 year. A portion of actinic keratoses will progress to squamous cell carcinoma of the skin. It is not possible to reliably predict which spots will progress to skin cancer and so treatment is recommended to prevent development of skin cancer.  Recommend daily broad spectrum sunscreen SPF 30+ to sun-exposed areas, reapply every 2 hours as  needed.  Recommend staying in the shade or wearing long sleeves, sun glasses (UVA+UVB protection) and wide brim hats (4-inch brim around the entire circumference of the hat). Call for new or changing lesions.   Cryotherapy Aftercare  Wash gently with soap and water everyday.   Apply Vaseline and Band-Aid daily until healed.     Seborrheic Keratosis  What causes seborrheic keratoses? Seborrheic keratoses are harmless, common skin growths that first appear during adult life.  As time goes by, more growths appear.  Some people may develop a large number of them.   Seborrheic keratoses appear on both covered and uncovered body parts.  They are not caused by sunlight.  The tendency to develop seborrheic keratoses can be inherited.  They vary in color from skin-colored to gray, brown, or even black.  They can be either smooth or have a rough, warty surface.   Seborrheic keratoses are superficial and look as if they were stuck on the skin.  Under the microscope this type of keratosis looks like layers upon layers of skin.  That is why at times the top layer may seem to fall off, but the rest of the growth remains and re-grows.    Treatment Seborrheic keratoses do not need to be treated, but can easily be removed in the office.  Seborrheic keratoses often cause symptoms when they rub on clothing or jewelry.  Lesions can be in the way of shaving.  If they become inflamed, they can cause itching, soreness, or burning.  Removal of a seborrheic keratosis can be accomplished by freezing, burning, or surgery. If any spot bleeds, scabs, or grows rapidly, please return to have it checked, as these can be an indication of a skin cancer.     Melanoma ABCDEs  Melanoma is the most dangerous type of skin cancer, and is the leading cause of death from skin disease.  You are more likely to develop melanoma if you: Have light-colored skin, light-colored eyes, or red or blond hair Spend a lot of time in the sun Tan regularly, either outdoors or in a tanning bed Have had blistering sunburns, especially during childhood Have a close family member who has had a melanoma Have atypical moles or large birthmarks  Early detection of melanoma is key since treatment is typically straightforward and cure rates are extremely high if we catch it early.   The first sign of melanoma is often a change in a mole or a new dark spot.  The ABCDE system is a way of remembering the signs of melanoma.  A for asymmetry:  The two halves do not match. B for border:  The edges of the growth are  irregular. C for color:  A mixture of colors are present instead of an even brown color. D for diameter:  Melanomas are usually (but not always) greater than 6mm - the size of a pencil eraser. E for evolution:  The spot keeps changing in size, shape, and color.  Please check your skin once per month between visits. You can use a small mirror in front and a large mirror behind you to keep an eye on the back side or your body.   If you see any new or changing lesions before your next follow-up, please call to schedule a visit.  Please continue daily skin protection including broad spectrum sunscreen SPF 30+ to sun-exposed areas, reapplying every 2 hours as needed when you're outdoors.   Staying in the shade or wearing long sleeves, sun  glasses (UVA+UVB protection) and wide brim hats (4-inch brim around the entire circumference of the hat) are also recommended for sun protection.    Due to recent changes in healthcare laws, you may see results of your pathology and/or laboratory studies on MyChart before the doctors have had a chance to review them. We understand that in some cases there may be results that are confusing or concerning to you. Please understand that not all results are received at the same time and often the doctors may need to interpret multiple results in order to provide you with the best plan of care or course of treatment. Therefore, we ask that you please give [place] 2 business days to thoroughly review all your results before contacting the office for clarification. Should we see a critical lab result, you will be contacted sooner.   If You Need Anything After Your Visit  If you have any questions or concerns for your doctor, please call our main line at [phone] and press option 4 to reach your doctor's medical assistant. If no one answers, please leave a voicemail as directed and we will return your call as soon as possible. Messages left after 4 pm will be answered the  following business day.   You may also send [place] a message via MyChart. We typically respond to MyChart messages within 1-2 business days.  For prescription refills, please ask your pharmacy to contact our office. Our fax number is [phone].  If you have an urgent issue when the clinic is closed that cannot wait until the next business day, you can page your doctor at the number below.    Please note that while we do our best to be available for urgent issues outside of office hours, we are not available 24/7.   If you have an urgent issue and are unable to reach [place], you may choose to seek medical care at your doctor's office, retail clinic, urgent care center, or emergency room.  If you have a medical emergency, please immediately call 911 or go to the emergency department.  Pager Numbers  - Dr. Gwen Pounds: [phone]  - Dr. Roseanne Reno: [phone]  - Dr. Katrinka Blazing: [phone]   In the event of inclement weather, please call our main line at [phone] for an update on the status of any delays or closures.  Dermatology Medication Tips: Please keep the boxes that topical medications come in in order to help keep track of the instructions about where and how to use these. Pharmacies typically print the medication instructions only on the boxes and not directly on the medication tubes.   If your medication is too expensive, please contact our office at [phone] option 4 or send [place] a message through MyChart.   We are unable to tell what your co-pay for medications will be in advance as this is different depending on your insurance coverage. However, we may be able to find a substitute medication at lower cost or fill out paperwork to get insurance to cover a needed medication.   If a prior authorization is required to get your medication covered by your insurance company, please allow [place] 1-2 business days to complete this process.  Drug prices often vary depending on where the  prescription is filled and some pharmacies may offer cheaper prices.  The website www.goodrx.com contains coupons for medications through different pharmacies. The prices here do not account for what the cost may be with help from insurance (it may be cheaper with your insurance),  but the website can give you the price if you did not use any insurance.  - You can print the associated coupon and take it with your prescription to the pharmacy.  - You may also stop by our office during regular business hours and pick up a GoodRx coupon card.  - If you need your prescription sent electronically to a different pharmacy, notify our office through [facility] or by phone at [phone] option 4.     Si Usted Necesita Algo Despus de Su Visita  Tambin puede enviarnos un mensaje a travs de Clinical cytogeneticist. Por lo general respondemos a los mensajes de MyChart en el transcurso de 1 a 2 das hbiles.  Para renovar recetas, por favor pida a su farmacia que se ponga en contacto con nuestra oficina. Annie Sable de fax es [place] [phone].  Si tiene un asunto urgente cuando la clnica est cerrada y que no puede esperar hasta el siguiente da hbil, puede llamar/localizar a su doctor(a) al nmero que aparece a continuacin.   Por favor, tenga en cuenta que aunque hacemos todo lo posible para estar disponibles para asuntos urgentes fuera del horario de [place], no estamos disponibles las 24 horas del da, los 7 [address]  [address] de la [place].   Si tiene un problema urgente y no puede comunicarse con nosotros, puede optar por buscar atencin mdica  en el consultorio de su doctor(a), en una clnica privada, en un centro de atencin urgente o en una sala de emergencias.  Si tiene Engineer, drilling, por favor llame inmediatamente al 911 o vaya a la sala de emergencias.  Nmeros de bper  - Dr. Gwen Pounds: [phone]  - Dra. Roseanne Reno: [phone]  - Dr. Katrinka Blazing: [phone]   En caso de inclemencias del  tiempo, por favor llame a Lacy Duverney principal al [phone] para una actualizacin sobre el [place] de cualquier retraso o cierre.  Consejos para la medicacin en dermatologa: Por favor, guarde las cajas en las que vienen los medicamentos de uso tpico para ayudarle a seguir las instrucciones sobre dnde y cmo usarlos. Las farmacias generalmente imprimen las instrucciones del medicamento slo en las cajas y no directamente en los tubos del [place].   Si su medicamento es muy caro, por favor, pngase en contacto con Rolm Gala llamando al [phone] y presione la opcin 4 o envenos un mensaje a travs de Clinical cytogeneticist.   No podemos decirle cul ser su copago por los medicamentos por adelantado ya que esto es diferente dependiendo de la cobertura de su seguro. Sin embargo, es posible que podamos encontrar un medicamento sustituto a Audiological scientist un formulario para que el seguro cubra el medicamento que se considera necesario.   Si se requiere una autorizacin previa para que su compaa de seguros [place] su medicamento, por favor permtanos de 1 a 2 das hbiles para completar [address]  Los precios de los medicamentos varan con frecuencia dependiendo del Environmental consultant de dnde se surte la receta y alguna farmacias pueden ofrecer precios ms baratos.  El sitio web www.goodrx.com tiene cupones para medicamentos de Health and safety inspector. Los precios aqu no tienen en cuenta lo que podra costar con la ayuda del seguro (puede ser ms barato con su seguro), pero el sitio web puede darle el precio si no utiliz Tourist information centre manager.  - Puede imprimir el cupn correspondiente y llevarlo con su receta a la farmacia.  - Tambin puede pasar por nuestra oficina durante el horario de atencin regular y Education officer, museum una tarjeta de cupones  de GoodRx.  - Si necesita que su receta se enve electrnicamente a una farmacia diferente, informe a nuestra oficina a travs de MyChart de [place] o por telfono  llamando al [phone] y presione la opcin 4.

## 2024-01-20 ENCOUNTER — Encounter: Payer: Self-pay | Admitting: Dermatology

## 2024-01-25 LAB — SURGICAL PATHOLOGY

## 2024-01-26 ENCOUNTER — Telehealth: Payer: Self-pay

## 2024-01-26 NOTE — Telephone Encounter (Addendum)
 Called and discussed bx results with patient. Patient denied further questions and verbalized understanding. Will recheck at next follow up  ----- Message from Celine Collard sent at 01/26/2024 11:46 AM EDT ----- FINAL DIAGNOSIS        1. Skin, right posterior shoulder / deltoid :       DYSPLASTIC COMPOUND NEVUS WITH MODERATE ATYPIA, PERIPHERAL AND DEEP MARGINS       INVOLVED   Moderate dysplastic Recheck next visit

## 2025-01-23 ENCOUNTER — Ambulatory Visit: Admitting: Dermatology
